# Patient Record
Sex: Male | Born: 1935 | Race: White | Hispanic: No | Marital: Single
Health system: Midwestern US, Academic
[De-identification: ages and names within clinical notes are randomized; demographics above are authoritative.]

---

## 2016-05-21 MED ORDER — WARFARIN 6 MG PO TAB
ORAL_TABLET | Freq: Every day | ORAL | 1 refills | 90.00000 days | Status: DC
Start: 2016-05-21 — End: 2017-11-20

## 2016-06-25 ENCOUNTER — Encounter: Admit: 2016-06-25 | Discharge: 2016-06-25 | Payer: MEDICARE

## 2016-07-03 ENCOUNTER — Ambulatory Visit: Admit: 2016-07-03 | Discharge: 2016-07-04 | Payer: MEDICARE

## 2016-07-03 DIAGNOSIS — I2510 Atherosclerotic heart disease of native coronary artery without angina pectoris: Principal | ICD-10-CM

## 2016-07-03 DIAGNOSIS — R001 Bradycardia, unspecified: ICD-10-CM

## 2016-07-03 DIAGNOSIS — Z950 Presence of cardiac pacemaker: ICD-10-CM

## 2016-07-04 ENCOUNTER — Encounter: Admit: 2016-07-04 | Discharge: 2016-07-04 | Payer: MEDICARE

## 2016-07-04 DIAGNOSIS — Z950 Presence of cardiac pacemaker: Principal | ICD-10-CM

## 2016-07-04 DIAGNOSIS — R001 Bradycardia, unspecified: ICD-10-CM

## 2016-07-09 ENCOUNTER — Encounter: Admit: 2016-07-09 | Discharge: 2016-07-09 | Payer: MEDICARE

## 2016-07-09 DIAGNOSIS — Z952 Presence of prosthetic heart valve: Principal | ICD-10-CM

## 2016-07-09 DIAGNOSIS — Z7901 Long term (current) use of anticoagulants: ICD-10-CM

## 2016-07-14 ENCOUNTER — Encounter: Admit: 2016-07-14 | Discharge: 2016-07-14 | Payer: MEDICARE

## 2016-07-14 MED ORDER — CARVEDILOL 6.25 MG PO TAB
3.125 mg | ORAL_TABLET | Freq: Two times a day (BID) | ORAL | 0 refills | 90.00000 days | Status: AC
Start: 2016-07-14 — End: 2017-02-12

## 2016-07-14 NOTE — Telephone Encounter
Recent hx of atach on device.  Pt c/o of new episodes of near syncope, recently.  Discussed with TLR orders received to Coreg titration upto 12.5mg  bid.

## 2016-07-23 ENCOUNTER — Encounter: Admit: 2016-07-23 | Discharge: 2016-07-23 | Payer: MEDICARE

## 2016-07-23 DIAGNOSIS — Z7901 Long term (current) use of anticoagulants: ICD-10-CM

## 2016-07-23 DIAGNOSIS — Z952 Presence of prosthetic heart valve: Principal | ICD-10-CM

## 2016-08-13 ENCOUNTER — Encounter: Admit: 2016-08-13 | Discharge: 2016-08-13 | Payer: MEDICARE

## 2016-08-13 NOTE — Telephone Encounter
INR Overdue. Called and left message requesting pt to have INR drawn at earliest convenience.  Left callback number for any questions or concerns.

## 2016-08-18 ENCOUNTER — Encounter: Admit: 2016-08-18 | Discharge: 2016-08-18 | Payer: MEDICARE

## 2016-08-20 ENCOUNTER — Encounter: Admit: 2016-08-20 | Discharge: 2016-08-20 | Payer: MEDICARE

## 2016-08-20 DIAGNOSIS — Z7901 Long term (current) use of anticoagulants: ICD-10-CM

## 2016-08-20 DIAGNOSIS — Z952 Presence of prosthetic heart valve: Principal | ICD-10-CM

## 2016-09-03 ENCOUNTER — Encounter: Admit: 2016-09-03 | Discharge: 2016-09-03 | Payer: MEDICARE

## 2016-09-03 DIAGNOSIS — Z952 Presence of prosthetic heart valve: Principal | ICD-10-CM

## 2016-09-03 DIAGNOSIS — Z7901 Long term (current) use of anticoagulants: ICD-10-CM

## 2016-09-24 ENCOUNTER — Encounter: Admit: 2016-09-24 | Discharge: 2016-09-24 | Payer: MEDICARE

## 2016-09-24 NOTE — Telephone Encounter
INR Overdue. Called and left message requesting pt to have INR drawn at earliest convenience.  Left callback number for any questions or concerns.

## 2016-10-03 NOTE — Telephone Encounter
INR Overdue.  Called and left message requesting pt to have INR drawn at earliest convenience.  Left callback number for any questions or concerns.

## 2016-10-15 ENCOUNTER — Encounter: Admit: 2016-10-15 | Discharge: 2016-10-15 | Payer: MEDICARE

## 2016-10-15 DIAGNOSIS — Z952 Presence of prosthetic heart valve: Principal | ICD-10-CM

## 2016-10-15 DIAGNOSIS — Z7901 Long term (current) use of anticoagulants: ICD-10-CM

## 2016-10-15 MED ORDER — WARFARIN 7.5 MG PO TAB
7.5 mg | ORAL_TABLET | Freq: Every day | ORAL | 3 refills | 90.00000 days | Status: AC
Start: 2016-10-15 — End: 2018-02-02

## 2016-10-31 LAB — LIPID PROFILE
Lab: 154 — ABNORMAL HIGH (ref <100)
Lab: 215 — ABNORMAL HIGH (ref 150–200)
Lab: 26 — ABNORMAL LOW (ref 33.0–37.0)
Lab: 5 — ABNORMAL HIGH (ref 8.8–10.0)

## 2016-10-31 LAB — COMPREHENSIVE METABOLIC PANEL
Lab: 1.6 — ABNORMAL HIGH (ref 0.0–1.0)
Lab: 137 — ABNORMAL LOW (ref 4.70–6.10)
Lab: 16 — ABNORMAL HIGH (ref 0–14)
Lab: 20
Lab: 3.8
Lab: 6.8
Lab: 66
Lab: 8

## 2016-10-31 LAB — PROSTATIC SPECIFIC ANTIGEN-PSA: Lab: 4 — ABNORMAL HIGH (ref 0.0–4.0)

## 2016-10-31 LAB — THYROID STIMULATING HORMONE-TSH: Lab: 1

## 2016-10-31 LAB — CBC: Lab: 10 — ABNORMAL HIGH (ref 4.8–10.8)

## 2016-11-05 ENCOUNTER — Encounter: Admit: 2016-11-05 | Discharge: 2016-11-05 | Payer: MEDICARE

## 2016-11-05 DIAGNOSIS — Z7901 Long term (current) use of anticoagulants: ICD-10-CM

## 2016-11-05 DIAGNOSIS — Z952 Presence of prosthetic heart valve: Principal | ICD-10-CM

## 2016-11-05 NOTE — Progress Notes
Spoke to pt re: INR results. Verified current Coumadin dose. Reviewed dose with pt for accuracy and he states he is taking 7.5mg  on Sat and 6mg  all other days. Flowsheet changed to reflect dose pt is actually taking, no actual dose change made.  Will continue current therapeutic dose and recheck in ~ 1 week. Pt verbalized understanding and was agreeable to this plan.  No further needs identified at this time.

## 2016-11-12 ENCOUNTER — Encounter: Admit: 2016-11-12 | Discharge: 2016-11-12 | Payer: MEDICARE

## 2016-11-12 DIAGNOSIS — Z7901 Long term (current) use of anticoagulants: ICD-10-CM

## 2016-11-12 DIAGNOSIS — Z952 Presence of prosthetic heart valve: Principal | ICD-10-CM

## 2016-11-26 ENCOUNTER — Encounter: Admit: 2016-11-26 | Discharge: 2016-11-26 | Payer: MEDICARE

## 2016-11-28 ENCOUNTER — Encounter: Admit: 2016-11-28 | Discharge: 2016-11-28 | Payer: MEDICARE

## 2016-11-28 DIAGNOSIS — Z952 Presence of prosthetic heart valve: Principal | ICD-10-CM

## 2016-12-17 ENCOUNTER — Encounter: Admit: 2016-12-17 | Discharge: 2016-12-17 | Payer: MEDICARE

## 2016-12-17 NOTE — Telephone Encounter
INR Overdue. Called and left message requesting pt to have INR drawn at earliest convenience.  Left callback number for any questions or concerns.

## 2016-12-26 NOTE — Telephone Encounter
INR overdue. Called and left message requesting pt to have INR drawn at earliest convenience. Left callback number for any questions or concerns.

## 2017-01-07 ENCOUNTER — Encounter: Admit: 2017-01-07 | Discharge: 2017-01-07 | Payer: MEDICARE

## 2017-01-07 DIAGNOSIS — Z7901 Long term (current) use of anticoagulants: ICD-10-CM

## 2017-01-07 DIAGNOSIS — Z952 Presence of prosthetic heart valve: Principal | ICD-10-CM

## 2017-01-07 NOTE — Progress Notes
A detailed message was left on secured voicemail reviewing the coumadin dosing that we understand they are on currently, and instructions of dosing as of today and when the next INR is due.  Patient was asked to call [phone] to confirm his dosing.

## 2017-01-21 ENCOUNTER — Encounter: Admit: 2017-01-21 | Discharge: 2017-01-21 | Payer: MEDICARE

## 2017-01-21 DIAGNOSIS — Z7901 Long term (current) use of anticoagulants: ICD-10-CM

## 2017-01-21 DIAGNOSIS — Z952 Presence of prosthetic heart valve: Principal | ICD-10-CM

## 2017-02-05 ENCOUNTER — Encounter: Admit: 2017-02-05 | Discharge: 2017-02-05 | Payer: MEDICARE

## 2017-02-12 ENCOUNTER — Ambulatory Visit: Admit: 2017-02-12 | Discharge: 2017-02-13 | Payer: MEDICARE

## 2017-02-12 ENCOUNTER — Encounter: Admit: 2017-02-12 | Discharge: 2017-02-12 | Payer: MEDICARE

## 2017-02-12 DIAGNOSIS — I779 Disorder of arteries and arterioles, unspecified: ICD-10-CM

## 2017-02-12 DIAGNOSIS — K2970 Gastritis, unspecified, without bleeding: ICD-10-CM

## 2017-02-12 DIAGNOSIS — I6529 Occlusion and stenosis of unspecified carotid artery: ICD-10-CM

## 2017-02-12 DIAGNOSIS — G459 Transient cerebral ischemic attack, unspecified: ICD-10-CM

## 2017-02-12 DIAGNOSIS — I2510 Atherosclerotic heart disease of native coronary artery without angina pectoris: ICD-10-CM

## 2017-02-12 DIAGNOSIS — E785 Hyperlipidemia, unspecified: Principal | ICD-10-CM

## 2017-02-12 DIAGNOSIS — I350 Nonrheumatic aortic (valve) stenosis: ICD-10-CM

## 2017-02-12 DIAGNOSIS — K222 Esophageal obstruction: ICD-10-CM

## 2017-02-12 MED ORDER — TAMSULOSIN 0.4 MG PO CAP
.8 mg | ORAL_CAPSULE | Freq: Every day | ORAL | 3 refills | 90.00000 days | Status: AC
Start: 2017-02-12 — End: 2019-02-17

## 2017-02-18 ENCOUNTER — Encounter: Admit: 2017-02-18 | Discharge: 2017-02-18 | Payer: MEDICARE

## 2017-02-18 DIAGNOSIS — Z7901 Long term (current) use of anticoagulants: Principal | ICD-10-CM

## 2017-02-18 LAB — PROTIME INR (PT): Lab: 2.5

## 2017-03-04 ENCOUNTER — Encounter: Admit: 2017-03-04 | Discharge: 2017-03-04 | Payer: MEDICARE

## 2017-03-04 DIAGNOSIS — Z952 Presence of prosthetic heart valve: Principal | ICD-10-CM

## 2017-03-04 DIAGNOSIS — Z7901 Long term (current) use of anticoagulants: ICD-10-CM

## 2017-03-04 LAB — PROTIME INR (PT): Lab: 2.5 mg/dL (ref 0.4–1.00)

## 2017-03-18 ENCOUNTER — Encounter: Admit: 2017-03-18 | Discharge: 2017-03-18 | Payer: MEDICARE

## 2017-04-01 ENCOUNTER — Encounter: Admit: 2017-04-01 | Discharge: 2017-04-01 | Payer: MEDICARE

## 2017-04-01 DIAGNOSIS — Z7901 Long term (current) use of anticoagulants: Principal | ICD-10-CM

## 2017-04-01 LAB — PROTIME INR (PT): Lab: 2.5

## 2017-04-15 ENCOUNTER — Encounter: Admit: 2017-04-15 | Discharge: 2017-04-15 | Payer: MEDICARE

## 2017-04-15 DIAGNOSIS — Z7901 Long term (current) use of anticoagulants: Principal | ICD-10-CM

## 2017-04-15 LAB — PROTIME INR (PT): Lab: 2.5

## 2017-04-30 ENCOUNTER — Encounter: Admit: 2017-04-30 | Discharge: 2017-04-30 | Payer: MEDICARE

## 2017-04-30 DIAGNOSIS — Z7901 Long term (current) use of anticoagulants: ICD-10-CM

## 2017-04-30 DIAGNOSIS — Z952 Presence of prosthetic heart valve: Principal | ICD-10-CM

## 2017-05-13 ENCOUNTER — Encounter: Admit: 2017-05-13 | Discharge: 2017-05-13 | Payer: MEDICARE

## 2017-05-27 ENCOUNTER — Encounter: Admit: 2017-05-27 | Discharge: 2017-05-27 | Payer: MEDICARE

## 2017-05-27 DIAGNOSIS — Z7901 Long term (current) use of anticoagulants: ICD-10-CM

## 2017-05-27 DIAGNOSIS — Z952 Presence of prosthetic heart valve: Principal | ICD-10-CM

## 2017-05-27 LAB — PROTIME INR (PT): Lab: 2.9

## 2017-05-28 LAB — COMPREHENSIVE METABOLIC PANEL
Lab: 1
Lab: 1
Lab: 10
Lab: 10 — ABNORMAL HIGH (ref 8.8–10.0)
Lab: 113 — ABNORMAL HIGH (ref 83–110)
Lab: 139
Lab: 14
Lab: 18
Lab: 19
Lab: 24
Lab: 4.1
Lab: 67
Lab: 69
Lab: 7.9

## 2017-05-28 LAB — FREE T4 (FREE THYROXINE) ONLY: Lab: 1

## 2017-05-28 LAB — THYROID STIMULATING HORMONE-TSH: Lab: 1.1

## 2017-06-10 ENCOUNTER — Encounter: Admit: 2017-06-10 | Discharge: 2017-06-10 | Payer: MEDICARE

## 2017-06-10 DIAGNOSIS — Z7901 Long term (current) use of anticoagulants: ICD-10-CM

## 2017-06-10 DIAGNOSIS — Z952 Presence of prosthetic heart valve: Principal | ICD-10-CM

## 2017-06-10 LAB — PROTIME INR (PT): Lab: 2.5

## 2017-06-24 ENCOUNTER — Encounter: Admit: 2017-06-24 | Discharge: 2017-06-24 | Payer: MEDICARE

## 2017-06-24 DIAGNOSIS — Z7901 Long term (current) use of anticoagulants: ICD-10-CM

## 2017-06-24 DIAGNOSIS — Z952 Presence of prosthetic heart valve: Principal | ICD-10-CM

## 2017-06-24 LAB — PROTIME INR (PT): Lab: 2.9 (ref 1.003–1.035)

## 2017-07-08 ENCOUNTER — Encounter: Admit: 2017-07-08 | Discharge: 2017-07-08 | Payer: MEDICARE

## 2017-07-08 DIAGNOSIS — Z952 Presence of prosthetic heart valve: Principal | ICD-10-CM

## 2017-07-08 DIAGNOSIS — Z7901 Long term (current) use of anticoagulants: ICD-10-CM

## 2017-07-08 LAB — PROTIME INR (PT): Lab: 2.5 % — ABNORMAL HIGH (ref 0.0–3.0)

## 2017-07-22 ENCOUNTER — Encounter: Admit: 2017-07-22 | Discharge: 2017-07-22 | Payer: MEDICARE

## 2017-07-22 DIAGNOSIS — Z7901 Long term (current) use of anticoagulants: ICD-10-CM

## 2017-07-22 DIAGNOSIS — Z952 Presence of prosthetic heart valve: Principal | ICD-10-CM

## 2017-07-22 LAB — PROTIME INR (PT): Lab: 2.5

## 2017-08-05 ENCOUNTER — Encounter: Admit: 2017-08-05 | Discharge: 2017-08-05 | Payer: MEDICARE

## 2017-08-05 DIAGNOSIS — Z7901 Long term (current) use of anticoagulants: ICD-10-CM

## 2017-08-05 DIAGNOSIS — Z952 Presence of prosthetic heart valve: Principal | ICD-10-CM

## 2017-08-05 LAB — PROTIME INR (PT): Lab: 2.5 M/UL (ref 4.4–5.5)

## 2017-08-18 ENCOUNTER — Encounter: Admit: 2017-08-18 | Discharge: 2017-08-18 | Payer: MEDICARE

## 2017-08-18 DIAGNOSIS — Z950 Presence of cardiac pacemaker: Principal | ICD-10-CM

## 2017-08-19 ENCOUNTER — Encounter: Admit: 2017-08-19 | Discharge: 2017-08-19 | Payer: MEDICARE

## 2017-08-19 DIAGNOSIS — Z7901 Long term (current) use of anticoagulants: ICD-10-CM

## 2017-08-19 DIAGNOSIS — Z952 Presence of prosthetic heart valve: Principal | ICD-10-CM

## 2017-08-19 LAB — PROTIME INR (PT): Lab: 2.5 pg (ref 26–34)

## 2017-08-25 ENCOUNTER — Ambulatory Visit: Admit: 2017-08-25 | Discharge: 2017-08-26 | Payer: MEDICARE

## 2017-08-25 ENCOUNTER — Encounter: Admit: 2017-08-25 | Discharge: 2017-08-25 | Payer: MEDICARE

## 2017-08-25 DIAGNOSIS — Z950 Presence of cardiac pacemaker: ICD-10-CM

## 2017-08-25 DIAGNOSIS — R001 Bradycardia, unspecified: Principal | ICD-10-CM

## 2017-09-03 ENCOUNTER — Encounter: Admit: 2017-09-03 | Discharge: 2017-09-03 | Payer: MEDICARE

## 2017-09-03 ENCOUNTER — Ambulatory Visit: Admit: 2017-09-03 | Discharge: 2017-09-04 | Payer: MEDICARE

## 2017-09-03 DIAGNOSIS — I350 Nonrheumatic aortic (valve) stenosis: ICD-10-CM

## 2017-09-03 DIAGNOSIS — Z952 Presence of prosthetic heart valve: Principal | ICD-10-CM

## 2017-09-03 DIAGNOSIS — K222 Esophageal obstruction: ICD-10-CM

## 2017-09-03 DIAGNOSIS — Z7901 Long term (current) use of anticoagulants: ICD-10-CM

## 2017-09-03 DIAGNOSIS — I2510 Atherosclerotic heart disease of native coronary artery without angina pectoris: ICD-10-CM

## 2017-09-03 DIAGNOSIS — G459 Transient cerebral ischemic attack, unspecified: ICD-10-CM

## 2017-09-03 DIAGNOSIS — I6529 Occlusion and stenosis of unspecified carotid artery: ICD-10-CM

## 2017-09-03 DIAGNOSIS — E7800 Pure hypercholesterolemia, unspecified: ICD-10-CM

## 2017-09-03 DIAGNOSIS — I779 Disorder of arteries and arterioles, unspecified: ICD-10-CM

## 2017-09-03 DIAGNOSIS — E785 Hyperlipidemia, unspecified: Principal | ICD-10-CM

## 2017-09-03 DIAGNOSIS — K2970 Gastritis, unspecified, without bleeding: ICD-10-CM

## 2017-09-16 ENCOUNTER — Encounter: Admit: 2017-09-16 | Discharge: 2017-09-16 | Payer: MEDICARE

## 2017-09-16 DIAGNOSIS — Z7901 Long term (current) use of anticoagulants: ICD-10-CM

## 2017-09-16 DIAGNOSIS — Z952 Presence of prosthetic heart valve: Principal | ICD-10-CM

## 2017-09-16 LAB — PROTIME INR (PT): Lab: 2.5 mg/dL (ref 0.4–1.24)

## 2017-09-23 ENCOUNTER — Encounter: Admit: 2017-09-23 | Discharge: 2017-09-23 | Payer: MEDICARE

## 2017-09-23 DIAGNOSIS — Z952 Presence of prosthetic heart valve: Principal | ICD-10-CM

## 2017-09-23 DIAGNOSIS — Z7901 Long term (current) use of anticoagulants: ICD-10-CM

## 2017-09-23 LAB — PROTIME INR (PT): Lab: 3.2 mg/dL — ABNORMAL HIGH (ref 70–100)

## 2017-10-07 ENCOUNTER — Encounter: Admit: 2017-10-07 | Discharge: 2017-10-07 | Payer: MEDICARE

## 2017-10-07 DIAGNOSIS — Z7901 Long term (current) use of anticoagulants: ICD-10-CM

## 2017-10-07 DIAGNOSIS — Z952 Presence of prosthetic heart valve: Principal | ICD-10-CM

## 2017-10-07 LAB — PROTIME INR (PT): Lab: 2.9

## 2017-10-21 ENCOUNTER — Encounter: Admit: 2017-10-21 | Discharge: 2017-10-21 | Payer: MEDICARE

## 2017-10-21 LAB — PROTIME INR (PT): Lab: 3.5

## 2017-11-04 ENCOUNTER — Encounter: Admit: 2017-11-04 | Discharge: 2017-11-04 | Payer: MEDICARE

## 2017-11-04 DIAGNOSIS — Z7901 Long term (current) use of anticoagulants: ICD-10-CM

## 2017-11-04 DIAGNOSIS — Z952 Presence of prosthetic heart valve: Principal | ICD-10-CM

## 2017-11-04 LAB — PROTIME INR (PT): Lab: 2.8

## 2017-11-18 ENCOUNTER — Encounter: Admit: 2017-11-18 | Discharge: 2017-11-18 | Payer: MEDICARE

## 2017-11-18 LAB — PROTIME INR (PT): Lab: 3.1

## 2017-11-20 ENCOUNTER — Encounter: Admit: 2017-11-20 | Discharge: 2017-11-20 | Payer: MEDICARE

## 2017-11-20 MED ORDER — WARFARIN 6 MG PO TAB
ORAL_TABLET | Freq: Every day | ORAL | 1 refills | 90.00000 days | Status: AC
Start: 2017-11-20 — End: ?

## 2017-12-04 ENCOUNTER — Encounter: Admit: 2017-12-04 | Discharge: 2017-12-04 | Payer: MEDICARE

## 2017-12-04 DIAGNOSIS — Z952 Presence of prosthetic heart valve: Principal | ICD-10-CM

## 2017-12-04 DIAGNOSIS — Z7901 Long term (current) use of anticoagulants: ICD-10-CM

## 2017-12-04 LAB — PROTIME INR (PT): Lab: 3.2

## 2017-12-23 ENCOUNTER — Encounter: Admit: 2017-12-23 | Discharge: 2017-12-23 | Payer: MEDICARE

## 2017-12-23 DIAGNOSIS — Z952 Presence of prosthetic heart valve: Principal | ICD-10-CM

## 2017-12-23 DIAGNOSIS — Z7901 Long term (current) use of anticoagulants: ICD-10-CM

## 2017-12-23 LAB — PROTIME INR (PT): Lab: 2.5

## 2017-12-31 LAB — COMPREHENSIVE METABOLIC PANEL
Lab: 0.5
Lab: 1.1 — ABNORMAL LOW (ref 33.0–37.0)
Lab: 10
Lab: 109 — ABNORMAL HIGH (ref 98–107)
Lab: 11
Lab: 12
Lab: 130 — ABNORMAL HIGH (ref 70–105)
Lab: 17
Lab: 21 — ABNORMAL HIGH (ref 27.0–31.0)
Lab: 22 — ABNORMAL LOW (ref 23–31)
Lab: 3.7
Lab: 64
Lab: 7.4
Lab: 91

## 2017-12-31 LAB — THYROID STIMULATING HORMONE-TSH: Lab: 1.5 — ABNORMAL LOW (ref 14.0–18.0)

## 2018-01-08 ENCOUNTER — Encounter: Admit: 2018-01-08 | Discharge: 2018-01-08 | Payer: MEDICARE

## 2018-01-08 DIAGNOSIS — Z952 Presence of prosthetic heart valve: ICD-10-CM

## 2018-01-08 DIAGNOSIS — Z7901 Long term (current) use of anticoagulants: Principal | ICD-10-CM

## 2018-01-20 ENCOUNTER — Encounter: Admit: 2018-01-20 | Discharge: 2018-01-20 | Payer: MEDICARE

## 2018-01-20 DIAGNOSIS — Z952 Presence of prosthetic heart valve: Secondary | ICD-10-CM

## 2018-01-20 DIAGNOSIS — Z7901 Long term (current) use of anticoagulants: Secondary | ICD-10-CM

## 2018-01-20 LAB — PROTIME INR (PT): Lab: 3.2 mg/dL — ABNORMAL HIGH (ref 70–100)

## 2018-02-02 ENCOUNTER — Encounter: Admit: 2018-02-02 | Discharge: 2018-02-02 | Payer: MEDICARE

## 2018-02-02 MED ORDER — WARFARIN 7.5 MG PO TAB
ORAL_TABLET | Freq: Every day | ORAL | 3 refills | 90.00000 days | Status: AC
Start: 2018-02-02 — End: 2019-02-04

## 2018-02-03 ENCOUNTER — Encounter: Admit: 2018-02-03 | Discharge: 2018-02-03 | Payer: MEDICARE

## 2018-02-03 DIAGNOSIS — Z952 Presence of prosthetic heart valve: Secondary | ICD-10-CM

## 2018-02-03 DIAGNOSIS — Z7901 Long term (current) use of anticoagulants: Secondary | ICD-10-CM

## 2018-02-03 LAB — PROTIME INR (PT): Lab: 3.5 10*3/uL (ref 0–0.45)

## 2018-02-17 ENCOUNTER — Encounter: Admit: 2018-02-17 | Discharge: 2018-02-17 | Payer: MEDICARE

## 2018-02-17 DIAGNOSIS — Z7901 Long term (current) use of anticoagulants: ICD-10-CM

## 2018-02-17 DIAGNOSIS — Z952 Presence of prosthetic heart valve: Principal | ICD-10-CM

## 2018-02-17 LAB — PROTIME INR (PT): Lab: 2.8

## 2018-03-03 ENCOUNTER — Encounter: Admit: 2018-03-03 | Discharge: 2018-03-03 | Payer: MEDICARE

## 2018-03-03 DIAGNOSIS — Z7901 Long term (current) use of anticoagulants: ICD-10-CM

## 2018-03-03 DIAGNOSIS — Z952 Presence of prosthetic heart valve: Principal | ICD-10-CM

## 2018-03-17 ENCOUNTER — Encounter: Admit: 2018-03-17 | Discharge: 2018-03-17 | Payer: MEDICARE

## 2018-03-17 DIAGNOSIS — Z952 Presence of prosthetic heart valve: Principal | ICD-10-CM

## 2018-03-17 DIAGNOSIS — Z7901 Long term (current) use of anticoagulants: ICD-10-CM

## 2018-03-17 LAB — PROTIME INR (PT): Lab: 2.5

## 2018-03-31 ENCOUNTER — Encounter: Admit: 2018-03-31 | Discharge: 2018-03-31 | Payer: MEDICARE

## 2018-04-14 ENCOUNTER — Encounter: Admit: 2018-04-14 | Discharge: 2018-04-14 | Payer: MEDICARE

## 2018-04-14 DIAGNOSIS — Z952 Presence of prosthetic heart valve: Principal | ICD-10-CM

## 2018-04-14 DIAGNOSIS — Z7901 Long term (current) use of anticoagulants: ICD-10-CM

## 2018-04-28 ENCOUNTER — Encounter: Admit: 2018-04-28 | Discharge: 2018-04-28 | Payer: MEDICARE

## 2018-04-28 DIAGNOSIS — Z952 Presence of prosthetic heart valve: Principal | ICD-10-CM

## 2018-04-28 DIAGNOSIS — Z7901 Long term (current) use of anticoagulants: ICD-10-CM

## 2018-05-12 ENCOUNTER — Encounter: Admit: 2018-05-12 | Discharge: 2018-05-12 | Payer: MEDICARE

## 2018-05-26 ENCOUNTER — Encounter: Admit: 2018-05-26 | Discharge: 2018-05-26 | Payer: MEDICARE

## 2018-06-01 ENCOUNTER — Ambulatory Visit: Admit: 2018-06-01 | Discharge: 2018-06-02 | Payer: MEDICARE

## 2018-06-01 ENCOUNTER — Encounter: Admit: 2018-06-01 | Discharge: 2018-06-01 | Payer: MEDICARE

## 2018-06-01 DIAGNOSIS — R001 Bradycardia, unspecified: ICD-10-CM

## 2018-06-01 DIAGNOSIS — Z950 Presence of cardiac pacemaker: ICD-10-CM

## 2018-06-09 ENCOUNTER — Encounter: Admit: 2018-06-09 | Discharge: 2018-06-09 | Payer: MEDICARE

## 2018-06-11 ENCOUNTER — Encounter: Admit: 2018-06-11 | Discharge: 2018-06-11 | Payer: MEDICARE

## 2018-06-15 ENCOUNTER — Ambulatory Visit: Admit: 2018-06-15 | Discharge: 2018-06-16

## 2018-06-15 ENCOUNTER — Encounter: Admit: 2018-06-15 | Discharge: 2018-06-15

## 2018-06-15 DIAGNOSIS — E785 Hyperlipidemia, unspecified: Secondary | ICD-10-CM

## 2018-06-15 DIAGNOSIS — G459 Transient cerebral ischemic attack, unspecified: Secondary | ICD-10-CM

## 2018-06-15 DIAGNOSIS — I6529 Occlusion and stenosis of unspecified carotid artery: Secondary | ICD-10-CM

## 2018-06-15 DIAGNOSIS — K222 Esophageal obstruction: Secondary | ICD-10-CM

## 2018-06-15 DIAGNOSIS — I779 Disorder of arteries and arterioles, unspecified: Secondary | ICD-10-CM

## 2018-06-15 DIAGNOSIS — I2510 Atherosclerotic heart disease of native coronary artery without angina pectoris: Secondary | ICD-10-CM

## 2018-06-15 DIAGNOSIS — I350 Nonrheumatic aortic (valve) stenosis: Secondary | ICD-10-CM

## 2018-06-15 DIAGNOSIS — M25511 Pain in right shoulder: Secondary | ICD-10-CM

## 2018-06-15 DIAGNOSIS — W19XXXS Unspecified fall, sequela: Secondary | ICD-10-CM

## 2018-06-15 DIAGNOSIS — K2970 Gastritis, unspecified, without bleeding: Secondary | ICD-10-CM

## 2018-06-15 NOTE — Progress Notes
Date of Service: 06/15/2018    Dominic Olsen is a 85 y.o. male.       HPI     I saw Dominic Olsen today to follow up his nearing ERI status for his pacemaker.     That has not been troubling him, but he had a fall about 2-1/2 weeks ago from a high chair, landed on his right shoulder.  He has had profound discomfort and decreased range of motion since that time.  He did not want to go to the hospital for an x-rays, but I think it is clear that he has had significant trauma to his right shoulder, head, and I think an x-ray is very appropriate at this time to try and sort out exactly the nature of his injury.  We will try to arrange that this afternoon and then make arrangements for orthopedic referral.  He called his chiropractor about the symptoms, but they felt that it was something that they did want to deal with because of its severity, and he has not seen them.  He has been limping along since that time, trying to get by without seeking medical attention, but it is clear he has severely restricted motion of his right shoulder.  That fall also was the occasion for periorbital bleeding.  He did not seek medical attention for that either.    ([id number])             Vitals:    06/15/18 1538 06/15/18 1545   BP: 118/68 122/70   BP Source: Arm, Left Upper Arm, Right Upper   Pulse:  103   SpO2: 96%    Weight: 88.9 kg (196 lb)    Height: 1.778 m (5' 10)    PainSc: Zero      Body mass index is 28.12 kg/m???.     Past Medical History  Patient Active Problem List    Diagnosis Date Noted   ??? Preoperative cardiovascular examination 05/10/2015   ??? CAD (coronary artery disease) 03/23/2015     11/29/2007   IMPRESSIONS:  1. Critical mid left anterior descending artery disease status post 3.0 x 15 Xience drug-  ??? ???eluting stent deployment postdilated to 3.25 with TIMI 3 flow postprocedure, 0 percent residual stenosis, and no complication.  2. Prosthetic aortic valve noted.       ??? S/P aortic valve replacement 05/25/2008 ??? Stable angina (HCC) 11/26/2007   ??? Abnormal cardiovascular stress test 11/26/2007   ??? Chronic anticoagulation 11/26/2007   ??? 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 Numbness of arm 02/09/2006   ??? Aortic stenosis      AVR     ??? Hyperlipidemia    ??? Gastritis    ??? Esophageal stricture          Review of Systems   Constitution: Negative.   HENT: Negative.    Eyes: Negative.    Cardiovascular: Positive for claudication.   Respiratory: Negative.    Endocrine: Negative.    Hematologic/Lymphatic: Negative.    Skin: Negative.    Musculoskeletal: Negative.    Gastrointestinal: Negative.    Genitourinary: Negative.    Neurological: Negative.    Psychiatric/Behavioral: Positive for depression.   Allergic/Immunologic: Negative.        Physical Exam  Examination reveals an 85 year old gentleman obviously in some continued distress.  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  His aortic valve was for aortic stenosis and regurgitation.     He has been mostly conservative management since that time.  He has no peripheral edema, cyanosis, or clubbing.  Pulses intact in the upper extremities bilaterally.    ([id number])        Cardiovascular Studies  He is currently approaching ERI and will need every-6-week checks until he reaches elective replacement.    ([id number])        Problems Addressed Today  No diagnosis found.    Assessment and Plan     In summary, this is an 85 year old gentleman, on Coumadin for aortic valve replacement previously, also stent angioplasty, pacemaker for carotid sinus hypersensitivity, and now with a fall without syncope with some minor facial trauma, but with a significant right shoulder injury from which he has been in severe pain and with limited shoulder motion.  I am concerned that he may have had an unrecognized fracture.  He has not had x-rays.  We will do those as soon as possible, and he will need orthopedic consultation as well.  We will start with the x-rays.    ([id number])             Current Medications (including today's revisions)  ??? MULTIVITAMIN PO Take 1 tablet by mouth daily.   ??? tamsulosin (FLOMAX) 0.4 mg capsule Take two capsules by mouth daily. Do not crush, chew or open capsules. Take 30 minutes following the same meal each day.   ??? warfarin (COUMADIN) 6 mg tablet TAKE 1 TO 2 TABLETS BY MOUTH ONCE DAILY AS  DIRECTED   ??? warfarin (COUMADIN) 7.5 mg tablet TAKE 1 TABLET BY MOUTH ONCE DAILY AS  DIRECTED  BY  CARDIOLOGY

## 2018-06-16 ENCOUNTER — Encounter: Admit: 2018-06-16 | Discharge: 2018-06-16

## 2018-06-16 NOTE — Progress Notes
Called pt with results and recommendations.  Per TLR pt will need MRI of R shoulder.  Pt has a PM.  Will check to see if PM is MRI compatible and then schedule MRI.  Pt would like MRI done at [facility] or [facility].  Will call pt back to schedule.     R shoulder xray:

## 2018-06-17 ENCOUNTER — Encounter: Admit: 2018-06-17 | Discharge: 2018-06-17

## 2018-06-23 ENCOUNTER — Encounter: Admit: 2018-06-23 | Discharge: 2018-06-23

## 2018-06-23 DIAGNOSIS — Z952 Presence of prosthetic heart valve: Secondary | ICD-10-CM

## 2018-06-23 DIAGNOSIS — Z7901 Long term (current) use of anticoagulants: Secondary | ICD-10-CM

## 2018-07-01 ENCOUNTER — Encounter: Admit: 2018-07-01 | Discharge: 2018-07-01

## 2018-07-01 DIAGNOSIS — Z952 Presence of prosthetic heart valve: Secondary | ICD-10-CM

## 2018-07-01 DIAGNOSIS — I350 Nonrheumatic aortic (valve) stenosis: Secondary | ICD-10-CM

## 2018-07-01 DIAGNOSIS — Z7901 Long term (current) use of anticoagulants: Secondary | ICD-10-CM

## 2018-07-02 ENCOUNTER — Encounter: Admit: 2018-07-02 | Discharge: 2018-07-02

## 2018-07-02 NOTE — Telephone Encounter
Patient called back.  Pt wishes to hold off having the MRI for now.  He reports his should pain is improving and does not want to drive to [place].  We will postpone MRI for now and I will call pt next month to f/u.  No further needs identified at this time.

## 2018-07-06 NOTE — Progress Notes
Patient reports shoulder pain is improving and wishes to hold off completing the MRI.  Advised pt to call if he changes his mind.  No further needs at this time.

## 2018-07-07 ENCOUNTER — Encounter: Admit: 2018-07-07 | Discharge: 2018-07-07

## 2018-07-07 DIAGNOSIS — I350 Nonrheumatic aortic (valve) stenosis: Secondary | ICD-10-CM

## 2018-07-07 DIAGNOSIS — Z952 Presence of prosthetic heart valve: Secondary | ICD-10-CM

## 2018-07-07 DIAGNOSIS — Z7901 Long term (current) use of anticoagulants: Secondary | ICD-10-CM

## 2018-07-07 LAB — PROTIME INR (PT): Lab: 2.7

## 2018-07-21 ENCOUNTER — Encounter: Admit: 2018-07-21 | Discharge: 2018-07-21

## 2018-07-21 DIAGNOSIS — Z952 Presence of prosthetic heart valve: Secondary | ICD-10-CM

## 2018-07-21 DIAGNOSIS — Z7901 Long term (current) use of anticoagulants: Secondary | ICD-10-CM

## 2018-07-21 NOTE — Progress Notes
07/21/2018 1:23 PM  INR 2.5. No adjustments made. Pt usually makes adjustments on his own which he has been advised not to. Pt/LL      Pt also mentions he had an episode on Monday where he became overheated and felt like he was having a heat stroke.  He mentioned he was dizzy and his gait was unsteady.  He rested and hydrated himself and felt better.  I mentioned this could also be signs of TIA and offered to make an appt for the patient to be seen in our office, but pt declined. I advised pt to call back if he changes his mind or if sx return to call 911. Pt verbalized understanding.

## 2018-07-27 ENCOUNTER — Ambulatory Visit: Admit: 2018-07-27 | Discharge: 2018-07-28

## 2018-07-27 ENCOUNTER — Encounter: Admit: 2018-07-27 | Discharge: 2018-07-27

## 2018-07-27 DIAGNOSIS — Z1159 Encounter for screening for other viral diseases: Secondary | ICD-10-CM

## 2018-07-27 DIAGNOSIS — R001 Bradycardia, unspecified: Secondary | ICD-10-CM

## 2018-07-27 DIAGNOSIS — Z950 Presence of cardiac pacemaker: Secondary | ICD-10-CM

## 2018-07-27 DIAGNOSIS — I350 Nonrheumatic aortic (valve) stenosis: Secondary | ICD-10-CM

## 2018-07-27 MED ORDER — CEFAZOLIN INJ 1GM IVP
1 g | INTRAVENOUS | 0 refills | Status: CN
Start: 2018-07-27 — End: ?

## 2018-07-27 MED ORDER — LIDOCAINE (PF) 10 MG/ML (1 %) IJ SOLN
.1-2 mL | INTRAMUSCULAR | 0 refills | Status: CN | PRN
Start: 2018-07-27 — End: ?

## 2018-07-27 MED ORDER — CEFAZOLIN INJ 1GM IVP
2 g | Freq: Once | INTRAVENOUS | 0 refills | Status: CN
Start: 2018-07-27 — End: ?

## 2018-07-27 NOTE — Telephone Encounter
Left VM to CB and discuss battery change for device.    Available dates currently with MPE: 7/21, 7/23, 7/29 and 8/7.  Instructions done and case entered-can be scheduled from the depot.  He needs an H&P-possibly APP at [facility] clinic?

## 2018-07-27 NOTE — Telephone Encounter
-----   Message from Charlane Ferretti, RN sent at 07/27/2018  1:07 PM CDT -----  Regarding: MPE implanted, needs gen change  Has dual chamber medtronic PM Dr. Larina Bras  implanted.  Pt of Dr. Aris [place]  No remote  Hit ERI - "replacement time" 07/07/18 @ 11:20am  Needs set up for gen change

## 2018-07-28 NOTE — Telephone Encounter
MPE- RC at 5 :09pm yesterday to schedule gent change. He is at [phone].   Received: Today      Call patient   Message Contents   Louis Meckel, LPN  P Cvm Nurse Ep     This RN contacted pt in regards to scheduling generator change with MPE. Available dates currently with MPE: 7/21, 7/23, 7/29 and 8/7 reviewed with pt. Pt stated that he will call us back with date. Pt verbalized understanding. Encouraged pt to call us with any further questions or concerns.   RN will send follow up reminder to EP pool.

## 2018-07-29 ENCOUNTER — Encounter: Admit: 2018-07-29 | Discharge: 2018-07-29

## 2018-07-29 ENCOUNTER — Ambulatory Visit: Admit: 2018-08-05 | Discharge: 2018-08-05

## 2018-07-29 DIAGNOSIS — Z950 Presence of cardiac pacemaker: Secondary | ICD-10-CM

## 2018-07-29 DIAGNOSIS — I495 Sick sinus syndrome: Secondary | ICD-10-CM

## 2018-07-29 DIAGNOSIS — R001 Bradycardia, unspecified: Secondary | ICD-10-CM

## 2018-07-29 LAB — BASIC METABOLIC PANEL
Lab: 0.9
Lab: 107
Lab: 111 — ABNORMAL HIGH (ref 98–107)
Lab: 140
Lab: 17
Lab: 22 — ABNORMAL LOW (ref 23–31)
Lab: 4.7
Lab: 9.6

## 2018-07-29 LAB — CBC
Lab: 13
Lab: 247
Lab: 32
Lab: 32 — ABNORMAL HIGH (ref 27–31)
Lab: 37 — ABNORMAL LOW (ref 42–52)
Lab: 98
Lab: 6.5

## 2018-07-29 LAB — MAGNESIUM: Lab: 2.1

## 2018-07-29 NOTE — Progress Notes
Medicare is listed as patient's primary insurance coverage.  Pre-certification is not required for hospitalizations.

## 2018-07-30 ENCOUNTER — Encounter: Admit: 2018-07-30 | Discharge: 2018-07-30

## 2018-07-30 NOTE — Telephone Encounter
-----   Message from Rich Reining, APRN-NP sent at 07/30/2018  9:22 AM CDT -----  Ok to proceed.    Katie  ----- Message -----  From: Richard Miu, RN  Sent: 07/29/2018   3:00 PM CDT  To: Cherlyn Labella App    FYI pt scheduled for gen change 7/23 with MPE.   Hgb 12.2 now from 13.1 (7 months ago).   ----- Message -----  From: Annette Stable  Sent: 07/29/2018   1:21 PM CDT  To: Cvm Nurse Ep

## 2018-07-30 NOTE — Progress Notes
FW: MPE implanted, needs gen change   Received: Today   Message Contents   Paulino Rily, RN  P Cvm Nurse Ep            Did pt CB with offered date?    Previous Messages      ----- Message -----   From: Thomasene Mohair, RN   Sent: 07/28/2018   To: Cvm Nurse Ep   Subject: FW: MPE implanted, needs gen change          Patient CB to schedule? Instructions completed - just need updated.     Case entered - can schedule from the depot!   ----- Message -----   From: Royann Shivers, Gasper Sells, RN   Sent: 07/27/2018  1:07 PM CDT   To: Cvm Nurse Ep   Subject: MPE implanted, needs gen change            Has dual chamber medtronic PM Dr. Larina Bras implanted. Pt of Dr. Aris Georgia   No remote   Hit ERI - "replacement time" 07/07/18 @ 11:20am   Needs set up for gen change         The last documentation we have related to scheduling this case was that we were waiting for the patient to [facility] w/ an agreeable procedure date (see 7/14 Telephone Encounter).    The case is scheduled for 7/23.  Instructions reflect a 7/23 procedure date.    Per Mel Kimmel via Skype...  Lucy edited it/added 07/23 date on 07/16 at 1011.

## 2018-08-02 LAB — COVID-19 (SARS-COV-2) PCR

## 2018-08-03 ENCOUNTER — Encounter: Admit: 2018-08-03 | Discharge: 2018-08-03

## 2018-08-03 ENCOUNTER — Ambulatory Visit: Admit: 2018-08-05 | Discharge: 2018-08-05

## 2018-08-03 ENCOUNTER — Ambulatory Visit: Admit: 2018-08-03 | Discharge: 2018-08-03

## 2018-08-03 NOTE — Telephone Encounter
Pt needs updated H/P prior to gen change.  Spoke with patient who is "very angry with our entire clinic and staff, I don't understand how you all cannot just figure this out."  "I don't have time to go to another appt. I sat in [facility] for hrs yesterday waiting for my COVID test and now this!"    Finally told patient that we will have to update on admit if ok with our providers.   Informed pt that I will CB.

## 2018-08-03 NOTE — Telephone Encounter
Spoke to patient regarding INR and time of arrival on Thursday.  Spoke with Johann Capers, RN in the EP lab and confirmed time of arrival to be 1030. Informed Tessa, RN that the patient will need updated H/P as his last was 6/2 and pt refused to travel for another appt "just because we can't keep things straight."    Pt verbalized understanding of POC.  EP Lab to call and review plan per protocol tomorrow.

## 2018-08-03 NOTE — Telephone Encounter
-----   Message from Rich Reining, APRN-NP sent at 08/03/2018  1:38 PM CDT -----  Patient scheduled for a gen change on Thursday.  No H&P scheduled.    Katie

## 2018-08-03 NOTE — Telephone Encounter
Called pt and LVM to check if pt obtained a INR today and to record result for upcoming procedure on 08/05/2018.  Pt instructed to CB to clinic with INR result.     Plan to CB pt later today if no INR recorded.

## 2018-08-04 ENCOUNTER — Encounter: Admit: 2018-08-04 | Discharge: 2018-08-04

## 2018-08-04 DIAGNOSIS — Z1159 Encounter for screening for other viral diseases: Secondary | ICD-10-CM

## 2018-08-04 NOTE — Telephone Encounter
-----   Message from Betsy Pries, RN sent at 07/31/2018  6:24 PM CDT -----  Regarding: COVID RESULTS  Please watch for COVID RESULTS.  Gen change scheduled for 7/23.  Pt will have COVID Testing done at [facility] on 7/20.

## 2018-08-04 NOTE — Telephone Encounter
Called patient.  Advised that negative COVID results received.     Called patient to advise.  He has no further questions at this time.

## 2018-08-04 NOTE — Telephone Encounter
Contacted patient and confirmed name and DOB. Patient advised that COVID-19 test results are negative. Advised that patient can continue with the procedure and should follow pre-procedure instructions. Advised that if they develop any concerning symptoms prior to the procedure to contact their procedure team, specialist, and/or PCP for assistance.     Jaykwon Morones, RN

## 2018-08-04 NOTE — Progress Notes
Cardiovascular Labs Scheduling Checklist   1. Date of procedure: 07/23    2. Arrival time: 1030    3. Patient instructed NPO after MN; clear liquids until: 0930    4. Instructed to check-in at the heart hospital registration desk and bring photo id, insurance cards and a current list of home medications.  Pack an overnight bag in the event you are admitted overnight:yes     5. If you have a history of sleep apnea and use a C-Pap or Bi-Pap machine, please bring it with you to the hospital:no    6. Have a driver available upon discharge as you may not be cleared to drive for 48 hours or more post procedure. (exception is RHC/biopsy patient with internal jugular approach not receiving sedation): yes    7. If the patient's language preference is other than English, please indicated native language and need for interpreter:      8. Patient instructed to drink 64 oz water the day before their procedure if applicable and not contraindicated: no    9. Patient instructed no caffeine for 24 hours before procedure: yes    10. Pre-procedure medications reviewed: yes  Hold the following Medications: Continue to take the following:                     11. Contrast allergy with need for contrast allergy prophylaxis: no    12. Patient instructed to bath with antibacterial soap or surgical scrub:yes    13. List all same day pre-procedure requirements, i.e. Labs/H&P/EKG:no    14. List any same day pre-procedure appointments:no    15. List any isolation precautions and organism:no    16. List any special considerations: no    17. Patient instructed to prepare for delays as there may be urgent or emergent cases: yes    18. Updated visitor guidelines reviewed  (1 visitor, only allowed in waiting room):yes    19. Patient acknowledges understanding of pre-procedure instructions: yes

## 2018-08-05 ENCOUNTER — Encounter: Admit: 2018-08-05 | Discharge: 2018-08-05

## 2018-08-05 ENCOUNTER — Ambulatory Visit: Admit: 2018-08-05 | Discharge: 2018-08-05

## 2018-08-05 DIAGNOSIS — G9001 Carotid sinus syncope: Secondary | ICD-10-CM

## 2018-08-05 DIAGNOSIS — K222 Esophageal obstruction: Secondary | ICD-10-CM

## 2018-08-05 DIAGNOSIS — I779 Disorder of arteries and arterioles, unspecified: Secondary | ICD-10-CM

## 2018-08-05 DIAGNOSIS — I350 Nonrheumatic aortic (valve) stenosis: Secondary | ICD-10-CM

## 2018-08-05 DIAGNOSIS — Z45010 Encounter for checking and testing of cardiac pacemaker pulse generator [battery]: Secondary | ICD-10-CM

## 2018-08-05 DIAGNOSIS — E785 Hyperlipidemia, unspecified: Secondary | ICD-10-CM

## 2018-08-05 DIAGNOSIS — G459 Transient cerebral ischemic attack, unspecified: Secondary | ICD-10-CM

## 2018-08-05 DIAGNOSIS — I2510 Atherosclerotic heart disease of native coronary artery without angina pectoris: Secondary | ICD-10-CM

## 2018-08-05 DIAGNOSIS — I495 Sick sinus syndrome: Principal | ICD-10-CM

## 2018-08-05 DIAGNOSIS — I6529 Occlusion and stenosis of unspecified carotid artery: Secondary | ICD-10-CM

## 2018-08-05 DIAGNOSIS — K2970 Gastritis, unspecified, without bleeding: Secondary | ICD-10-CM

## 2018-08-05 LAB — POC PT/INR: Lab: 1.9 — ABNORMAL HIGH (ref 0.8–1.2)

## 2018-08-05 MED ORDER — HYDROCODONE-ACETAMINOPHEN 5-325 MG PO TAB
1 | ORAL | 0 refills | Status: DC | PRN
Start: 2018-08-05 — End: 2018-08-06

## 2018-08-05 MED ORDER — CEFAZOLIN INJ 1GM IVP
2 g | Freq: Once | INTRAVENOUS | 0 refills | Status: CP
Start: 2018-08-05 — End: ?

## 2018-08-05 MED ORDER — CEPHALEXIN 500 MG PO CAP
500 mg | ORAL_CAPSULE | Freq: Four times a day (QID) | ORAL | 0 refills | Status: AC
Start: 2018-08-05 — End: ?

## 2018-08-05 MED ORDER — DOCUSATE SODIUM 100 MG PO CAP
100 mg | Freq: Every day | ORAL | 0 refills | Status: DC | PRN
Start: 2018-08-05 — End: 2018-08-06

## 2018-08-05 MED ORDER — TEMAZEPAM 15 MG PO CAP
15 mg | Freq: Every evening | ORAL | 0 refills | Status: DC | PRN
Start: 2018-08-05 — End: 2018-08-06

## 2018-08-05 MED ORDER — SODIUM CHLORIDE 0.9 % IV SOLP
1000 mL | INTRAVENOUS | 0 refills | Status: DC
Start: 2018-08-05 — End: 2018-08-06
  Administered 2018-08-05: 16:00:00 1000 mL via INTRAVENOUS

## 2018-08-05 MED ORDER — CEFAZOLIN INJ 1GM IVP
1 g | INTRAVENOUS | 0 refills | Status: DC
Start: 2018-08-05 — End: 2018-08-06

## 2018-08-05 MED ORDER — LIDOCAINE (PF) 10 MG/ML (1 %) IJ SOLN
.1-2 mL | INTRAMUSCULAR | 0 refills | Status: DC | PRN
Start: 2018-08-05 — End: 2018-08-06

## 2018-08-05 MED ORDER — ALUMINUM-MAGNESIUM HYDROXIDE 200-200 MG/5 ML PO SUSP
30 mL | ORAL | 0 refills | Status: DC | PRN
Start: 2018-08-05 — End: 2018-08-06

## 2018-08-05 MED ORDER — ACETAMINOPHEN 325 MG PO TAB
650 mg | ORAL | 0 refills | Status: DC | PRN
Start: 2018-08-05 — End: 2018-08-06

## 2018-08-05 NOTE — Progress Notes
08/05/18 1636   Vital Signs   BP (!) 167/63   Mean NBP (Calculated) 91 MM HG   Ruth NP notified of elevated BP. Will continue to monitor.

## 2018-08-05 NOTE — Progress Notes
Patient arrived on unit via ambulation accompanied by family. Patient transferred to the bed without assistance. Frailty score equals 4  Assessment completed, refer to flowsheet for details. Orders released, reviewed, and implemented as appropriate. Oriented to surroundings, call light within reach. Plan of care reviewed.  Will continue to monitor and assess.

## 2018-08-05 NOTE — Patient Instructions
Generator Change Post Procedure Instructions-TUKH  Call Immediately For The Following:  ??? Drainage- blood or pus coming from the incision  ??? Opening of the edges of the incision  ??? InCreased tenderness around the incision  ??? Temperature- warmth around the incision or fever  ??? Odor from the wound  ??? Redness worsening around the incision???  Caring for your incision  ??? Keep your incision clean and dry for 2-3 days after your procedure. Take sponge baths working around the incision during this time.  ??? May shower 2-3 days after the procedure, but avoid getting the dressing wet. Keep the incision site and dressing dry until your incision check appointment.  ??? Dressing will be removed at the incision check appointment by a nurse.  ??? If dressing becomes loose on the edges, try to tape down the loose areas to keep the dressing on until your incision check appointment.  ??? If there is bleeding or drainage please call our nurse triage line at [phone].  ??? Do not submerge incision in bath tub, pool, hot tub, or lake for 4 weeks.  ??? Avoid using deodorants, powders, creams, lotions, ointments, etc. on your incision for 4 weeks.  ??? There are no stitches to be removed. Steri-strips (strips of tape) will begin to fall off in 10-14 days. If they remain after 2 weeks, gently remove them when they are damp after your shower.  ??? Your incision should look better each day. If you notice unusual swelling, redness, drainage, have more pain at the site, or have a fever greater than 100 degrees, call us immediately.  ??? Be sure to complete ALL the antibiotics prescribed.??????  What are my activity restrictions?  ??? Do not mow the lawn (push or riding mover), use a weed whacker, or garden for 4 weeks (too much dust and dirt).  ??? You should be able to resume your normal routine after the first week.  ??? You may participate in sexual activity unless otherwise instructed.  ??? You may return to work within 3-5 days unless told otherwise. This will vary with your job, age, and overall physical condition.???  When do I follow up?  ??? Follow up in7-10 daysfor an incision check with a nurse at [facility] or your family doctor or nurse practitioner.  ??? Follow up with your primary cardiologist in 6 months with a full device evaluation and office visit.???  Other important information???  ??? Avoid, if possible, any dental, gastrointestinal, or urinary procedures for 1 month post device placement. If the procedure cannot be avoided, call your cardiologist as you may need antibiotics prior to your procedure.  ??? Report any of the following symptoms to [facility]  ? Dizziness or lightheadedness  ? Slow pulse  ? Feel like you may pass out or faint  ? Shortness of breath  ? If you notice any unusual swelling, redness, drainage, fever, or increasing pain at your incision site???  Who do I contact if I need to speak with someone????  During Business Hours: Call [facility] electrophysiology nurse triage line at [phone].  Nights and Weekends: Call [facility] on call doctor at [phone].  ???[facility]  ???[facility]iac Device Management??????  This???information is meant to serve as a resource to you and your family. It is not meant to be all inclusive. The members of the [facility] at [facility], [phone], will be glad to answer  any questions you may have about this booklet or your procedure.

## 2018-08-05 NOTE — H&P (View-Only)
History and Physical Update Note    Allergies:  Patient has no known allergies.    Lab/Radiology/Other Diagnostic Tests:  Hematology:    Lab Results   Component Value Date    HGB 12.2 07/29/2018    HCT 37.6 07/29/2018    PLTCT 247 07/29/2018    WBC 6.5 07/29/2018    NEUT 47 12/01/2007    ANC 3.18 12/01/2007    ALC 2.55 12/01/2007    MONA 10 12/01/2007    AMC 0.65 12/01/2007    ABC 0.05 12/01/2007    MCV 98.5 07/29/2018    MCHC 32.5 07/29/2018    MPV 8.0 12/01/2007    RDW 13.0 07/29/2018   , Coagulation:    Lab Results   Component Value Date    PT 12.4 02/09/2006    PTT 39.9 12/01/2007    INR 1.9 08/05/2018    INR 2.4 08/03/2018    and General Chemistry:    Lab Results   Component Value Date    NA 140 07/29/2018    K 4.7 07/29/2018    CL 111 07/29/2018    GAP 12 12/31/2017    BUN 17 07/29/2018    CR 0.98 07/29/2018    GLU 107 07/29/2018    CA 9.6 07/29/2018    ALBUMIN 3.7 12/31/2017    MG 2.1 07/29/2018    TOTBILI 0.50 12/31/2017     Point of Care Testing:  (Last 24 hours):         I have examined the patient, and there are no significant changes in their condition, from the previous H&P performed on 06/15/18.     Regarding he below mentioned injuries to his shoulder and head: patient reports he had  x ray imaging in [place] which he states were inconclusive. MRI was suggested but he declined. He still has limited range of motion with his right arm, but states that it's getting better daily and declines further evaluation.     No recent illnesses or hospitalizations.     Elmon Else, APRN-NP  Pager [phone]    --------------------------------------------------------------------------------------------------------------------------------------------  Dominic Olsen is a 85 y.o. male.     ???  HPI  I saw Mr. Visitacion today to follow up his nearing ERI status for his pacemaker.   ???  That has not been troubling him, but he had a fall about 2-1/2 weeks ago from a high chair, landed on his right shoulder.  He has had profound discomfort and decreased range of motion since that time.  He did not want to go to the hospital for an x-rays, but I think it is clear that he has had significant trauma to his right shoulder, head, and I think an x-ray is very appropriate at this time to try and sort out exactly the nature of his injury.  We will try to arrange that this afternoon and then make arrangements for orthopedic referral.  He called his chiropractor about the symptoms, but they felt that it was something that they did want to deal with because of its severity, and he has not seen them.  He has been limping along since that time, trying to get by without seeking medical attention, but it is clear he has severely restricted motion of his right shoulder.  That fall also was the occasion for periorbital bleeding.  He did not seek medical attention for that either.  ???  ([id number])  ???  ???  ???  ???  Vitals:   ??? 06/15/18 1538 06/15/18 1545   BP: 118/68 122/70   BP Source: Arm, Left Upper Arm, Right Upper   Pulse: ??? 103   SpO2: 96% ???   Weight: 88.9 kg (196 lb) ???   Height: 1.778 m (5' 10) ???   PainSc: Zero ???   ???  Body mass index is 28.12 kg/m???.   ???  Past Medical History  Patient Active Problem List   ??? Diagnosis Date Noted   ??? Preoperative cardiovascular examination 05/10/2015   ??? CAD (coronary artery disease) 03/23/2015   ??? ??? 11/29/2007   IMPRESSIONS:  1. Critical mid left anterior descending artery disease status post 3.0 x 15 Xience drug-  ??? ???eluting stent deployment postdilated to 3.25 with TIMI 3 flow postprocedure, 0 percent residual stenosis, and no complication.  2. Prosthetic aortic valve noted.  ???  ???   ??? S/P aortic valve replacement 05/25/2008   ??? Stable angina (HCC) 11/26/2007   ??? Abnormal cardiovascular stress test 11/26/2007   ??? Chronic anticoagulation 11/26/2007   ??? Cardiac pacemaker in situ 11/26/2007   ??? ??? Medtronic  ???   ??? Bradycardia 04/29/2007 ??? ??? Carotid Sinus Hypersensitivity  ???   ??? Numbness of arm 02/09/2006   ??? Aortic stenosis ???   ??? ??? AVR  ???   ??? Hyperlipidemia ???   ??? Gastritis ???   ??? Esophageal stricture ???   ???  ???  ???  Review of Systems   Constitution: Negative.   HENT: Negative.    Eyes: Negative.    Cardiovascular: Positive for claudication.   Respiratory: Negative.    Endocrine: Negative.    Hematologic/Lymphatic: Negative.    Skin: Negative.    Musculoskeletal: Negative.    Gastrointestinal: Negative.    Genitourinary: Negative.    Neurological: Negative.    Psychiatric/Behavioral: Positive for depression.   Allergic/Immunologic: Negative.    ???  ???  Physical Exam  Examination reveals an 85 year old gentleman obviously in some continued distress.  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  His aortic valve was for aortic stenosis and regurgitation.   ???  He has been mostly conservative management since that time.  He has no peripheral edema, cyanosis, or clubbing.  Pulses intact in the upper extremities bilaterally.  ???  ([id number])  ???  ???  ???  Cardiovascular Studies  He is currently approaching ERI and will need every-6-week checks until he reaches elective replacement.  ???  ([id number])  ???  ???  ???  Problems Addressed Today  No diagnosis found.  ???  Assessment and Plan  In summary, this is an 85 year old gentleman, on Coumadin for aortic valve replacement previously, also stent angioplasty, pacemaker for carotid sinus hypersensitivity, and now with a fall without syncope with some minor facial trauma, but with a significant right shoulder injury from which he has been in severe pain and with limited shoulder motion.  I am concerned that he may have had an unrecognized fracture.  He has not had x-rays.  We will do those as soon as possible, and he will need orthopedic consultation as well.  We will start with the x-rays.  ???  ([id number])  ???  ???  ???  ???  Current Medications (including today's revisions)  ??? MULTIVITAMIN PO Take 1 tablet by mouth daily.   ???  tamsulosin (FLOMAX) 0.4 mg capsule Take two capsules by mouth daily. Do not crush, chew or open capsules. Take 30 minutes following the same meal each day.   ??? warfarin (COUMADIN) 6 mg tablet TAKE 1 TO 2 TABLETS BY MOUTH ONCE DAILY AS  DIRECTED   ??? warfarin (COUMADIN) 7.5 mg tablet TAKE 1 TABLET BY MOUTH ONCE DAILY AS  DIRECTED  BY  CARDIOLOGY   ???

## 2018-08-05 NOTE — Progress Notes
Patient discharged to home with all belongings.  Discharge instructions, med reconciliation and home wound care instructions given and explained to patient and family both verbally and written.  Accompanied by son.  No complaints of pain or discomfort.   Left chest  remains clean, dry, and intact with no evidence of a hematoma after ambulation.  Patient escorted to lobby via Transport.  Patient to follow up with [facility]) or on-call physician with any additional questions or concerns.  All contact numbers provided.  Patient and family acceptant of DC instuctions and report understanding to all information.

## 2018-08-05 NOTE — Other
Cardiac Electrophysiology Brief Post-Procedure Note    Attending: Holland Commons, MD     EP FELLOW: Mayra Neer. 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  Intravenous prophylactic antibiotics were administered prior to the procedure.  After the site of implantation was prepped and drapped in the usual sterile fashion and after adequate anesthesia was given, the skin was infiltrated with local anesthetic.  The skin was incised with a #10 scalpel over the prior scar. Blunt and electrosurgical dissection was carried out to the pulse generator pocket. Of note there was some mild calcifications in the pocket when bluntly dissecting the device free.  Careful attention was paid not to damage the lead/leads.  The pulse generator was explanted and the leads were detached from the pulse generator.  The leads were tested for adequate sensing, impedances and pacing thresholds.  The leads were cleaned and dried thoroughly then attached to the appropriate ports on the new pulse generator after correctly identified from the serial numbers.  Tug testing was perfomed on all connections.  The pocket was copiously irrigated with normal saline containing antibiotics and subsequently observed.  Once adequate hemostasis was confirmed within the pocket, the device and leads were placed within  the pocket.  At this time it was difficult to place the generator in its previous position due to the way that the leads were laying in the pocket.  We needed to free up the proximal portion of the leads from the capsule in order to allow it to be placed in a more appropriate position within the pocket.  We placed all leads under the device and device header was positioned up in the pocket.     The pocket was closed with Continuous 2-0 Vicryl followed by continuous 2-0 Vicryl followed by 4-0 Monocryl. Steri-Strips were then applied over the incision followed by a sterile dressing. he tolerated the procedure well.    SEDATION: I was personally responsible for the administration of moderate sedation services during the procedure performed and I confirm requirements described in CPT section on moderate sedation were followed, including the use of an independent trained observer who had no other duties during the procedure. After providing fentanyl and midazolam, we achieved moderate conscious sedation, which was maintained throughout the procedure. The hemodynamic parameters, respiratory parameters, as well as neurological status was monitored throughout the procedure by the cath lab staff and myself. The total face-to-face time was 39 minutes (1544 to 1623).    -Please review full operative report for full details of the procedure    Anesthesia: Moderate sedation via RN     Estimated blood loss: 5 mL    Recommendations: (device)  - Pain control as needed  - No need for repeat device interrogation, ECG, or Chest X-Ray (generator change only)  - Keflex 500 mg PO tid x 3 days    - No heparin or LMWH products for 72 hours post-procedure  - Incision Check in one week    - Follow-up:  Dr. Naoma Diener in 3 months     -Dr. Holland Commons was present throughout the entire procedure.    Batul Diego A. Dareen Piano, DO  Cardiac Electrophysiology Fellow, PGY-7  Contact via [employer] or Pager #: [phone]  4:33 PM, 08/05/2018

## 2018-08-06 ENCOUNTER — Encounter: Admit: 2018-08-06 | Discharge: 2018-08-06

## 2018-08-12 ENCOUNTER — Encounter: Admit: 2018-08-12 | Discharge: 2018-08-12

## 2018-08-12 NOTE — Progress Notes
Removed Silverlon/Aquacel dressing. (Left or right) prepectoral incision is clean, dry, well approximated, and healing without evidence of drainage or discharge. Incision was closed using Dermabond and is dry and intact. Pt reports no adverse symptoms. Incision care, including signs and symptoms of infection, and Dermabond healing process reviewed(as below). Pt verbalized understanding and will remain in phone contact.    AVOID TOPICAL MEDICATIONS   Do not apply liquid or ointment medications or any other product to your wound while the  DERMABOND adhesive film is in place. These may loosen the film before your wound is healed.  KEEP WOUND DRY AND PROTECTED   You may occasionally and briefly wet your wound in the shower or bath. Do not soak or scrub  your wound, do not swim, and avoid periods of heavy perspiration until the DERMABOND  adhesive has naturally fallen off. After showering or bathing, gently blot your wound dry with a  soft towel. If a protective dressing is being used, apply a fresh, dry bandage, being sure to keep  the tape off the DERMABOND adhesive film.   Apply a clean, dry bandage over the wound if necessary to protect it.   Protect your wound from injury until the skin has had sufficient time to heal.   Do not scratch, rub, or pick at the DERMABOND adhesive film. This may loosen the film before  your wound is healed.   Protect the wound from prolonged exposure to sunlight or tanning lamps while the film is in  place.    Your incision should gradually look better each day. Please notify our office immediately if you notice any of the following:   -an increase in swelling or redness   -any drainage   -increasing pain at the incision site  -fever over 100 degrees or chills

## 2018-08-12 NOTE — Telephone Encounter
-----   Message from Ilean Skill, APRN-NP sent at 08/05/2018  3:52 PM CDT -----  Regarding: One week incision check. Phone call/picture due.  One week incision check. Phone call/picture due.

## 2018-08-12 NOTE — Telephone Encounter
I called and spoke to pt. He said that he has no way to take a picture of his incision and he would like to go to the [place] office for his incision check. Pt scheduled for 2pm today. Pt notified.

## 2018-08-18 ENCOUNTER — Encounter: Admit: 2018-08-18 | Discharge: 2018-08-18

## 2018-08-18 NOTE — Progress Notes
Left msg for pt at preferred # re: INR results. Pt to continue current therapeutic dose and recheck in ~ 2 weeks. Left cardiology [facility] nurse line # for call back prn. No further needs identified at this time.

## 2018-09-01 ENCOUNTER — Encounter: Admit: 2018-09-01 | Discharge: 2018-09-01

## 2018-09-01 DIAGNOSIS — I6529 Occlusion and stenosis of unspecified carotid artery: Secondary | ICD-10-CM

## 2018-09-01 DIAGNOSIS — Z7901 Long term (current) use of anticoagulants: Secondary | ICD-10-CM

## 2018-09-01 DIAGNOSIS — I2510 Atherosclerotic heart disease of native coronary artery without angina pectoris: Secondary | ICD-10-CM

## 2018-09-01 DIAGNOSIS — Z952 Presence of prosthetic heart valve: Secondary | ICD-10-CM

## 2018-09-01 DIAGNOSIS — K222 Esophageal obstruction: Secondary | ICD-10-CM

## 2018-09-01 DIAGNOSIS — E785 Hyperlipidemia, unspecified: Secondary | ICD-10-CM

## 2018-09-01 DIAGNOSIS — K2970 Gastritis, unspecified, without bleeding: Secondary | ICD-10-CM

## 2018-09-01 DIAGNOSIS — G459 Transient cerebral ischemic attack, unspecified: Secondary | ICD-10-CM

## 2018-09-01 DIAGNOSIS — I350 Nonrheumatic aortic (valve) stenosis: Secondary | ICD-10-CM

## 2018-09-01 DIAGNOSIS — I779 Disorder of arteries and arterioles, unspecified: Secondary | ICD-10-CM

## 2018-09-01 NOTE — Progress Notes
INR 2.5, LMOM to con't same dose and recheck in 2 weeks. tc

## 2018-09-15 ENCOUNTER — Encounter: Admit: 2018-09-15 | Discharge: 2018-09-15

## 2018-09-15 DIAGNOSIS — I350 Nonrheumatic aortic (valve) stenosis: Secondary | ICD-10-CM

## 2018-09-15 LAB — PROTIME INR (PT): Lab: 2.5

## 2018-10-01 ENCOUNTER — Encounter: Admit: 2018-10-01 | Discharge: 2018-10-01 | Payer: MEDICARE

## 2018-10-01 LAB — PROTIME INR (PT): Lab: 2.5

## 2018-10-13 ENCOUNTER — Encounter: Admit: 2018-10-13 | Discharge: 2018-10-13 | Payer: MEDICARE

## 2018-10-27 ENCOUNTER — Encounter: Admit: 2018-10-27 | Discharge: 2018-10-27 | Payer: MEDICARE

## 2018-10-27 DIAGNOSIS — Z7901 Long term (current) use of anticoagulants: Secondary | ICD-10-CM

## 2018-10-27 DIAGNOSIS — I350 Nonrheumatic aortic (valve) stenosis: Secondary | ICD-10-CM

## 2018-10-27 DIAGNOSIS — Z952 Presence of prosthetic heart valve: Secondary | ICD-10-CM

## 2018-10-27 LAB — PROTIME INR (PT): Lab: 2.8

## 2018-11-10 ENCOUNTER — Encounter: Admit: 2018-11-10 | Discharge: 2018-11-10 | Payer: MEDICARE

## 2018-11-10 DIAGNOSIS — Z952 Presence of prosthetic heart valve: Secondary | ICD-10-CM

## 2018-11-10 DIAGNOSIS — Z7901 Long term (current) use of anticoagulants: Secondary | ICD-10-CM

## 2018-11-10 DIAGNOSIS — I350 Nonrheumatic aortic (valve) stenosis: Secondary | ICD-10-CM

## 2018-11-10 NOTE — Progress Notes
11/10/2018 11:19 AM INR within range.  Left detailed voicemail for patient.  Continue current warfarin dosing and repeat INR on 11/24/18.  Reminded patient that he is due for OV with Dr. Aris Georgia.  Asked patient to please call back to schedule an appointment.  Jacklynn Barnacle, RN

## 2018-11-24 ENCOUNTER — Encounter: Admit: 2018-11-24 | Discharge: 2018-11-24 | Payer: MEDICARE

## 2018-11-24 DIAGNOSIS — Z7901 Long term (current) use of anticoagulants: Secondary | ICD-10-CM

## 2018-11-24 DIAGNOSIS — Z952 Presence of prosthetic heart valve: Secondary | ICD-10-CM

## 2018-11-24 DIAGNOSIS — I350 Nonrheumatic aortic (valve) stenosis: Secondary | ICD-10-CM

## 2018-11-24 LAB — PROTIME INR (PT): Lab: 2.5

## 2018-11-24 NOTE — Progress Notes
11/24/2018 1:31 PM   Mr Woolsey will continue same dose and recheck INR in 2 weeks.  I scheduled him for follow up in January with dr Aris Georgia at the [place] office. Gaynelle Cage, RN

## 2018-12-08 ENCOUNTER — Encounter: Admit: 2018-12-08 | Discharge: 2018-12-08 | Payer: MEDICARE

## 2018-12-08 DIAGNOSIS — Z952 Presence of prosthetic heart valve: Secondary | ICD-10-CM

## 2018-12-08 DIAGNOSIS — Z7901 Long term (current) use of anticoagulants: Secondary | ICD-10-CM

## 2018-12-08 NOTE — Progress Notes
11/25-INR 2.7. Pt denies any changes in diet or medications, reports no bleeding or other issues. Dosage of warfarin verified, pt verbalized understanding of instructions and when next INR is due to be drawn.

## 2019-01-05 ENCOUNTER — Encounter: Admit: 2019-01-05 | Discharge: 2019-01-05 | Payer: MEDICARE

## 2019-01-05 DIAGNOSIS — Z952 Presence of prosthetic heart valve: Secondary | ICD-10-CM

## 2019-01-05 DIAGNOSIS — Z7901 Long term (current) use of anticoagulants: Secondary | ICD-10-CM

## 2019-01-05 NOTE — Progress Notes
01/05/2019 9:09AM-INR 2.7. Called and LVM confirming INR results and instructions to continue with same warfarin dosing and recheck INR in approx. 2 weeks. Encouraged to return call for any concerns or need for clarification as needed.

## 2019-01-11 ENCOUNTER — Encounter: Admit: 2019-01-11 | Discharge: 2019-01-11 | Payer: MEDICARE

## 2019-01-11 NOTE — Progress Notes
Records Request    Medical records request for continuation of care:    Patient has appointment on 1.5.2021   with  Dr. Aris Georgia .    Please fax records to [facility] [place]    Request records:      Recent Labs        Thank you,      Cardiovascular Medicine  [facility]  [address] [place] [address]  Phone:  [phone]  Fax:  [phone]

## 2019-01-18 ENCOUNTER — Encounter: Admit: 2019-01-18 | Discharge: 2019-01-18 | Payer: MEDICARE

## 2019-01-18 DIAGNOSIS — K2970 Gastritis, unspecified, without bleeding: Secondary | ICD-10-CM

## 2019-01-18 DIAGNOSIS — I350 Nonrheumatic aortic (valve) stenosis: Secondary | ICD-10-CM

## 2019-01-18 DIAGNOSIS — G459 Transient cerebral ischemic attack, unspecified: Secondary | ICD-10-CM

## 2019-01-18 DIAGNOSIS — I2510 Atherosclerotic heart disease of native coronary artery without angina pectoris: Secondary | ICD-10-CM

## 2019-01-18 DIAGNOSIS — E785 Hyperlipidemia, unspecified: Secondary | ICD-10-CM

## 2019-01-18 DIAGNOSIS — I6529 Occlusion and stenosis of unspecified carotid artery: Secondary | ICD-10-CM

## 2019-01-18 DIAGNOSIS — I779 Disorder of arteries and arterioles, unspecified: Secondary | ICD-10-CM

## 2019-01-18 DIAGNOSIS — K222 Esophageal obstruction: Secondary | ICD-10-CM

## 2019-01-18 DIAGNOSIS — M25552 Pain in left hip: Secondary | ICD-10-CM

## 2019-02-04 ENCOUNTER — Encounter: Admit: 2019-02-04 | Discharge: 2019-02-04 | Payer: MEDICARE

## 2019-02-04 MED ORDER — WARFARIN 7.5 MG PO TAB
ORAL_TABLET | Freq: Every day | ORAL | 3 refills | 90.00000 days | Status: AC
Start: 2019-02-04 — End: ?

## 2019-02-14 ENCOUNTER — Encounter: Admit: 2019-02-14 | Discharge: 2019-02-14 | Payer: MEDICARE

## 2019-02-14 DIAGNOSIS — I350 Nonrheumatic aortic (valve) stenosis: Secondary | ICD-10-CM

## 2019-02-14 DIAGNOSIS — Z952 Presence of prosthetic heart valve: Secondary | ICD-10-CM

## 2019-02-14 NOTE — Progress Notes
Records Request    Medical records request for continuation of care:    Patient has appointment on 02/17/2019   with  Dr. Larwance Sachs* .    Please fax records to Cardiovascular Medicine [facility] [phone]    Request records:STAT          MRI- Lower Extremity Joint            Thank you,      Cardiovascular Medicine  [facility]  [address]  [place], [place] [address]  Phone:  [phone]  Fax:  [phone]

## 2019-02-15 ENCOUNTER — Encounter: Admit: 2019-02-15 | Discharge: 2019-02-15 | Payer: MEDICARE

## 2019-02-15 DIAGNOSIS — R001 Bradycardia, unspecified: Secondary | ICD-10-CM

## 2019-02-15 DIAGNOSIS — Z952 Presence of prosthetic heart valve: Secondary | ICD-10-CM

## 2019-02-15 DIAGNOSIS — Z7901 Long term (current) use of anticoagulants: Secondary | ICD-10-CM

## 2019-02-15 DIAGNOSIS — I350 Nonrheumatic aortic (valve) stenosis: Secondary | ICD-10-CM

## 2019-02-15 NOTE — Progress Notes
02/15/2019 3:20 PM  INR 4.7.  Was on a steroid, but finished a couple of days ago.  He has already taken his warfarin dose for today.  flowsheet updated to what pt is taking and actual dose reduced.  He is going to start going to the lab and no longer going to do home INR's.  Pt/LL

## 2019-02-17 ENCOUNTER — Encounter: Admit: 2019-02-17 | Discharge: 2019-02-17 | Payer: MEDICARE

## 2019-02-17 DIAGNOSIS — I350 Nonrheumatic aortic (valve) stenosis: Secondary | ICD-10-CM

## 2019-02-17 DIAGNOSIS — G459 Transient cerebral ischemic attack, unspecified: Secondary | ICD-10-CM

## 2019-02-17 DIAGNOSIS — Z952 Presence of prosthetic heart valve: Secondary | ICD-10-CM

## 2019-02-17 DIAGNOSIS — I779 Disorder of arteries and arterioles, unspecified: Secondary | ICD-10-CM

## 2019-02-17 DIAGNOSIS — M24152 Other articular cartilage disorders, left hip: Secondary | ICD-10-CM

## 2019-02-17 DIAGNOSIS — K2970 Gastritis, unspecified, without bleeding: Secondary | ICD-10-CM

## 2019-02-17 DIAGNOSIS — I2510 Atherosclerotic heart disease of native coronary artery without angina pectoris: Secondary | ICD-10-CM

## 2019-02-17 DIAGNOSIS — E785 Hyperlipidemia, unspecified: Secondary | ICD-10-CM

## 2019-02-17 DIAGNOSIS — K222 Esophageal obstruction: Secondary | ICD-10-CM

## 2019-02-17 DIAGNOSIS — I6529 Occlusion and stenosis of unspecified carotid artery: Secondary | ICD-10-CM

## 2019-02-17 NOTE — Patient Instructions
REFERRAL TO ORTHOPEDICS FAXED.  WE WILL CALL YOU WITH AN APPT TIME.

## 2019-02-17 NOTE — Progress Notes
Left message for [facility] in [place] requesting ASAP appt for further evaluation and tx of L labral tear.  Referral fax with copy of MRI hip.  Await return call.

## 2019-02-18 ENCOUNTER — Encounter: Admit: 2019-02-18 | Discharge: 2019-02-18 | Payer: MEDICARE

## 2019-03-08 ENCOUNTER — Encounter: Admit: 2019-03-08 | Discharge: 2019-03-08 | Payer: MEDICARE

## 2019-03-08 ENCOUNTER — Ambulatory Visit: Admit: 2019-03-08 | Discharge: 2019-03-08 | Payer: MEDICARE

## 2019-03-08 DIAGNOSIS — Z952 Presence of prosthetic heart valve: Secondary | ICD-10-CM

## 2019-03-08 DIAGNOSIS — Z7901 Long term (current) use of anticoagulants: Secondary | ICD-10-CM

## 2019-03-08 DIAGNOSIS — R001 Bradycardia, unspecified: Secondary | ICD-10-CM

## 2019-03-08 DIAGNOSIS — Z950 Presence of cardiac pacemaker: Secondary | ICD-10-CM

## 2019-03-15 ENCOUNTER — Encounter: Admit: 2019-03-15 | Discharge: 2019-03-15 | Payer: MEDICARE

## 2019-03-15 NOTE — Telephone Encounter
Received call from Dr. Marykay Lex office with [facility] [phone]).  They are calling to request approval from Dr. Geronimo Boot to hold warfarin for upcoming lumbar injections.  They are planning 3 injections and need to hold warfarin for 6 days prior to each injection.     Will discuss with TLR in clinic on 3/3 and will call his recommendations to Lawson Fiscal, Charity fundraiser at [facility].  She is agreeable to plan and will await our recommendations prior to scheduling injections.

## 2019-04-15 ENCOUNTER — Encounter: Admit: 2019-04-15 | Discharge: 2019-04-15 | Payer: MEDICARE

## 2019-04-15 NOTE — Progress Notes
INR Overdue.  Called and left message requesting pt to have INR drawn at earliest convenience.  Left callback number for any questions or concerns.

## 2019-05-16 ENCOUNTER — Encounter: Admit: 2019-05-16 | Discharge: 2019-05-16 | Payer: MEDICARE

## 2019-05-16 DIAGNOSIS — I350 Nonrheumatic aortic (valve) stenosis: Secondary | ICD-10-CM

## 2019-05-16 DIAGNOSIS — Z952 Presence of prosthetic heart valve: Secondary | ICD-10-CM

## 2019-06-14 ENCOUNTER — Encounter: Admit: 2019-06-14 | Discharge: 2019-06-14 | Payer: MEDICARE

## 2019-06-14 DIAGNOSIS — I350 Nonrheumatic aortic (valve) stenosis: Secondary | ICD-10-CM

## 2019-06-14 DIAGNOSIS — Z952 Presence of prosthetic heart valve: Secondary | ICD-10-CM

## 2019-06-14 LAB — PROTIME INR (PT): Lab: 2.5

## 2019-07-15 ENCOUNTER — Encounter: Admit: 2019-07-15 | Discharge: 2019-07-15 | Payer: MEDICARE

## 2019-07-15 DIAGNOSIS — Z952 Presence of prosthetic heart valve: Secondary | ICD-10-CM

## 2019-07-15 DIAGNOSIS — I350 Nonrheumatic aortic (valve) stenosis: Secondary | ICD-10-CM

## 2019-08-05 ENCOUNTER — Encounter: Admit: 2019-08-05 | Discharge: 2019-08-05 | Payer: MEDICARE

## 2019-08-05 ENCOUNTER — Ambulatory Visit: Admit: 2019-08-05 | Discharge: 2019-08-05 | Payer: MEDICARE

## 2019-08-05 DIAGNOSIS — R55 Syncope and collapse: Secondary | ICD-10-CM

## 2019-08-06 ENCOUNTER — Encounter: Admit: 2019-08-06 | Discharge: 2019-08-06 | Payer: MEDICARE

## 2019-08-06 NOTE — Telephone Encounter
Victorino Dike  w/[facility]) called asking if pt's device is MRI compatible. Cb# [phone].   Victorino Dike states that Pt was admitted w/ dizziness, is hypertensive w/ SBP 190s, NSR, labs are good.   Their CT scanner is down at this time.   I let her know pt has Medtronic Azure XT DR MRI Surescan device implanted 08/05/2018

## 2019-08-16 ENCOUNTER — Encounter: Admit: 2019-08-16 | Discharge: 2019-08-16 | Payer: MEDICARE

## 2019-08-16 DIAGNOSIS — Z952 Presence of prosthetic heart valve: Secondary | ICD-10-CM

## 2019-08-16 DIAGNOSIS — I350 Nonrheumatic aortic (valve) stenosis: Secondary | ICD-10-CM

## 2019-09-13 ENCOUNTER — Encounter: Admit: 2019-09-13 | Discharge: 2019-09-13 | Payer: MEDICARE

## 2019-09-13 DIAGNOSIS — Z952 Presence of prosthetic heart valve: Secondary | ICD-10-CM

## 2019-09-13 DIAGNOSIS — I350 Nonrheumatic aortic (valve) stenosis: Secondary | ICD-10-CM

## 2019-10-14 ENCOUNTER — Encounter: Admit: 2019-10-14 | Discharge: 2019-10-14 | Payer: MEDICARE

## 2019-10-14 DIAGNOSIS — Z952 Presence of prosthetic heart valve: Secondary | ICD-10-CM

## 2019-10-14 DIAGNOSIS — I350 Nonrheumatic aortic (valve) stenosis: Secondary | ICD-10-CM

## 2019-10-28 ENCOUNTER — Encounter: Admit: 2019-10-28 | Discharge: 2019-10-28 | Payer: MEDICARE

## 2019-10-28 NOTE — Telephone Encounter
Left msg for pt at home # re: overdue INR and asked him to have INR checked by next week and/or call us with an updated. Left cardiology [facility] nurse line # for call back prn. No further needs identified at this time.    ----- Message -----  From: Rogelia Boga, RN  Sent: 10/21/2019  To: Bridget Hartshorn Nurse [place]  Subject: INR Due 10/21/19                                  Elige Radon is due for an INR test on 10/21/19.

## 2019-11-14 ENCOUNTER — Encounter: Admit: 2019-11-14 | Discharge: 2019-11-14 | Payer: MEDICARE

## 2019-11-14 DIAGNOSIS — I350 Nonrheumatic aortic (valve) stenosis: Secondary | ICD-10-CM

## 2019-11-14 DIAGNOSIS — Z952 Presence of prosthetic heart valve: Secondary | ICD-10-CM

## 2019-11-14 LAB — PROTIME INR (PT): Lab: 2.2

## 2019-11-21 ENCOUNTER — Encounter: Admit: 2019-11-21 | Discharge: 2019-11-21 | Payer: MEDICARE

## 2019-11-21 MED ORDER — WARFARIN 6 MG PO TAB
ORAL_TABLET | Freq: Every day | ORAL | 3 refills | 90.00000 days | Status: AC
Start: 2019-11-21 — End: ?

## 2019-11-30 ENCOUNTER — Encounter: Admit: 2019-11-30 | Discharge: 2019-11-30 | Payer: MEDICARE

## 2019-11-30 NOTE — Telephone Encounter
Left msg for pt at home # re: overdue INR and asked him to have INR checked by next week and/or call us with an updated. Left cardiology [facility] nurse line # for call back prn. No further needs identified at this time.    ----- Message -----  From: Lauralee Evener, RN  Sent: 11/21/2019  To: Bridget Hartshorn Nurse [place]  Subject: INR Due 11/21/19                                  Dominic Olsen is due for an INR test on 11/21/19.

## 2019-12-14 ENCOUNTER — Encounter: Admit: 2019-12-14 | Discharge: 2019-12-14 | Payer: MEDICARE

## 2019-12-14 DIAGNOSIS — I350 Nonrheumatic aortic (valve) stenosis: Secondary | ICD-10-CM

## 2019-12-14 DIAGNOSIS — Z952 Presence of prosthetic heart valve: Secondary | ICD-10-CM

## 2019-12-14 LAB — PROTIME INR (PT): Lab: 2.1

## 2019-12-14 NOTE — Telephone Encounter
Left message for patient to call with an updated INR result that's overdue.Instructed patient to call back with results and for any other concerns. Phone number provided.

## 2020-01-16 ENCOUNTER — Encounter: Admit: 2020-01-16 | Discharge: 2020-01-16 | Payer: MEDICARE

## 2020-01-16 DIAGNOSIS — Z952 Presence of prosthetic heart valve: Secondary | ICD-10-CM

## 2020-01-16 DIAGNOSIS — Z7901 Long term (current) use of anticoagulants: Secondary | ICD-10-CM

## 2020-01-17 ENCOUNTER — Encounter: Admit: 2020-01-17 | Discharge: 2020-01-17 | Payer: MEDICARE

## 2020-01-17 MED ORDER — TAMSULOSIN 0.4 MG PO CAP
.8 mg | ORAL_CAPSULE | Freq: Every day | ORAL | 0 refills | 90.00000 days | Status: AC
Start: 2020-01-17 — End: ?

## 2020-02-14 ENCOUNTER — Encounter: Admit: 2020-02-14 | Discharge: 2020-02-14 | Payer: MEDICARE

## 2020-02-14 DIAGNOSIS — Z7901 Long term (current) use of anticoagulants: Secondary | ICD-10-CM

## 2020-02-14 DIAGNOSIS — Z952 Presence of prosthetic heart valve: Secondary | ICD-10-CM

## 2020-02-14 LAB — PROTIME INR (PT): Lab: 2.9

## 2020-03-15 ENCOUNTER — Encounter: Admit: 2020-03-15 | Discharge: 2020-03-15 | Payer: MEDICARE

## 2020-03-15 DIAGNOSIS — Z952 Presence of prosthetic heart valve: Secondary | ICD-10-CM

## 2020-03-15 DIAGNOSIS — Z7901 Long term (current) use of anticoagulants: Secondary | ICD-10-CM

## 2020-03-22 ENCOUNTER — Encounter: Admit: 2020-03-22 | Discharge: 2020-03-22 | Payer: MEDICARE

## 2020-03-22 DIAGNOSIS — Z7901 Long term (current) use of anticoagulants: Secondary | ICD-10-CM

## 2020-03-22 DIAGNOSIS — Z952 Presence of prosthetic heart valve: Secondary | ICD-10-CM

## 2020-03-29 ENCOUNTER — Encounter: Admit: 2020-03-29 | Discharge: 2020-03-29 | Payer: MEDICARE

## 2020-03-29 ENCOUNTER — Ambulatory Visit: Admit: 2020-03-29 | Discharge: 2020-03-29 | Payer: MEDICARE

## 2020-03-29 DIAGNOSIS — Z952 Presence of prosthetic heart valve: Secondary | ICD-10-CM

## 2020-03-29 DIAGNOSIS — Z950 Presence of cardiac pacemaker: Secondary | ICD-10-CM

## 2020-03-29 DIAGNOSIS — I350 Nonrheumatic aortic (valve) stenosis: Secondary | ICD-10-CM

## 2020-03-29 DIAGNOSIS — I2510 Atherosclerotic heart disease of native coronary artery without angina pectoris: Secondary | ICD-10-CM

## 2020-03-29 NOTE — Telephone Encounter
-----   Message from Thayer Headings, RN sent at 03/29/2020  1:35 PM CDT -----  Regarding: FW: Newly noted AF with RVR on pt's device today. Also device showing NSVT last fall <2 second duration.  I didn't realize this was a [facility] pt. I forwarded the msg to Dr. Geronimo Boot but I know he isn't very active on IB. Sorry for the delay.    Thanks,  Ryann Biomedical engineer RN  ----- Message -----  From: Cheri Guppy, RN  Sent: 03/29/2020  10:53 AM CDT  To: Richarda Blade, #  Subject: Newly noted AF with RVR on pt's device today#    Please review with Dr. Geronimo Boot.     New onset AT/AF with RVR seen on annual ppm check.  The pt is set to be seen Monday afternoon, on warfarin for aortic valve, but need to close the loop this is noted and added to pt's problem list.   He also has new NSVT seen on check. Please review report for details.     Would like to see if he wants to turn on ATP to treat the arrhythmias? May decrease time in arrhythmia. Currently, it is programed off.     Please review and respond when update received.     Thank you,   Victorino Dike

## 2020-03-29 NOTE — Telephone Encounter
Discussed with Dr. Geronimo Boot.  He would like to potential start patient on digoxin for rhythm control, but wants to check labs first.  He would like a BMP and Mag.  Faxed orders to [facility].    Discussed with patient.  He is agreeable to plan.  He will go get labs checked tomorrow in [place].

## 2020-03-30 ENCOUNTER — Encounter: Admit: 2020-03-30 | Discharge: 2020-03-30 | Payer: MEDICARE

## 2020-03-30 DIAGNOSIS — I2510 Atherosclerotic heart disease of native coronary artery without angina pectoris: Secondary | ICD-10-CM

## 2020-03-30 DIAGNOSIS — I350 Nonrheumatic aortic (valve) stenosis: Secondary | ICD-10-CM

## 2020-03-30 DIAGNOSIS — Z952 Presence of prosthetic heart valve: Secondary | ICD-10-CM

## 2020-03-30 LAB — BASIC METABOLIC PANEL
Lab: 1.1
Lab: 109 — ABNORMAL HIGH (ref 98–107)
Lab: 140
Lab: 15 — ABNORMAL HIGH (ref 0–14)
Lab: 21 — ABNORMAL LOW (ref 23–31)
Lab: 24
Lab: 9.9
Lab: 99

## 2020-03-30 LAB — MAGNESIUM: Lab: 2

## 2020-04-03 ENCOUNTER — Encounter: Admit: 2020-04-03 | Discharge: 2020-04-03 | Payer: MEDICARE

## 2020-04-03 DIAGNOSIS — Z45010 Encounter for checking and testing of cardiac pacemaker pulse generator [battery]: Secondary | ICD-10-CM

## 2020-04-03 DIAGNOSIS — I350 Nonrheumatic aortic (valve) stenosis: Secondary | ICD-10-CM

## 2020-04-03 DIAGNOSIS — E785 Hyperlipidemia, unspecified: Secondary | ICD-10-CM

## 2020-04-03 DIAGNOSIS — R9439 Abnormal result of other cardiovascular function study: Secondary | ICD-10-CM

## 2020-04-03 DIAGNOSIS — K222 Esophageal obstruction: Secondary | ICD-10-CM

## 2020-04-03 DIAGNOSIS — K2970 Gastritis, unspecified, without bleeding: Secondary | ICD-10-CM

## 2020-04-03 DIAGNOSIS — I2510 Atherosclerotic heart disease of native coronary artery without angina pectoris: Secondary | ICD-10-CM

## 2020-04-03 DIAGNOSIS — R001 Bradycardia, unspecified: Secondary | ICD-10-CM

## 2020-04-03 DIAGNOSIS — G459 Transient cerebral ischemic attack, unspecified: Secondary | ICD-10-CM

## 2020-04-03 DIAGNOSIS — I6529 Occlusion and stenosis of unspecified carotid artery: Secondary | ICD-10-CM

## 2020-04-03 DIAGNOSIS — I10 Essential (primary) hypertension: Secondary | ICD-10-CM

## 2020-04-03 DIAGNOSIS — I779 Disorder of arteries and arterioles, unspecified: Secondary | ICD-10-CM

## 2020-04-04 ENCOUNTER — Encounter: Admit: 2020-04-04 | Discharge: 2020-04-04 | Payer: MEDICARE

## 2020-04-04 DIAGNOSIS — Z45010 Encounter for checking and testing of cardiac pacemaker pulse generator [battery]: Secondary | ICD-10-CM

## 2020-04-04 DIAGNOSIS — R9439 Abnormal result of other cardiovascular function study: Secondary | ICD-10-CM

## 2020-04-04 DIAGNOSIS — I10 Essential (primary) hypertension: Secondary | ICD-10-CM

## 2020-04-04 DIAGNOSIS — R001 Bradycardia, unspecified: Secondary | ICD-10-CM

## 2020-04-13 ENCOUNTER — Encounter: Admit: 2020-04-13 | Discharge: 2020-04-13 | Payer: MEDICARE

## 2020-04-13 DIAGNOSIS — Z952 Presence of prosthetic heart valve: Secondary | ICD-10-CM

## 2020-04-13 DIAGNOSIS — Z7901 Long term (current) use of anticoagulants: Secondary | ICD-10-CM

## 2020-04-13 LAB — PROTIME INR (PT): Lab: 3

## 2020-04-23 ENCOUNTER — Encounter: Admit: 2020-04-23 | Discharge: 2020-04-23 | Payer: MEDICARE

## 2020-04-23 MED ORDER — WARFARIN 7.5 MG PO TAB
ORAL_TABLET | Freq: Every day | ORAL | 3 refills | 90.00000 days | Status: AC
Start: 2020-04-23 — End: ?

## 2020-05-15 ENCOUNTER — Encounter: Admit: 2020-05-15 | Discharge: 2020-05-15 | Payer: MEDICARE

## 2020-05-18 ENCOUNTER — Encounter: Admit: 2020-05-18 | Discharge: 2020-05-18 | Payer: MEDICARE

## 2020-05-18 DIAGNOSIS — Z7901 Long term (current) use of anticoagulants: Secondary | ICD-10-CM

## 2020-05-18 DIAGNOSIS — Z952 Presence of prosthetic heart valve: Secondary | ICD-10-CM

## 2020-05-18 LAB — PROTIME INR (PT): INR: 4.3 — ABNORMAL HIGH (ref 0.89–1.11)

## 2020-05-24 ENCOUNTER — Encounter: Admit: 2020-05-24 | Discharge: 2020-05-24 | Payer: MEDICARE

## 2020-05-24 DIAGNOSIS — Z7901 Long term (current) use of anticoagulants: Secondary | ICD-10-CM

## 2020-05-24 DIAGNOSIS — Z952 Presence of prosthetic heart valve: Secondary | ICD-10-CM

## 2020-06-06 ENCOUNTER — Encounter: Admit: 2020-06-06 | Discharge: 2020-06-06 | Payer: MEDICARE

## 2020-06-06 NOTE — Telephone Encounter
Called and spoke with patient regarding overdue INR. Patient states that he has been sick and not feeling well. Patient will get INR checked at earliest convenience.

## 2020-06-13 ENCOUNTER — Encounter: Admit: 2020-06-13 | Discharge: 2020-06-13 | Payer: MEDICARE

## 2020-06-13 DIAGNOSIS — Z7901 Long term (current) use of anticoagulants: Secondary | ICD-10-CM

## 2020-06-13 DIAGNOSIS — Z952 Presence of prosthetic heart valve: Secondary | ICD-10-CM

## 2020-07-13 ENCOUNTER — Encounter: Admit: 2020-07-13 | Discharge: 2020-07-13 | Payer: MEDICARE

## 2020-07-13 DIAGNOSIS — Z952 Presence of prosthetic heart valve: Secondary | ICD-10-CM

## 2020-07-13 DIAGNOSIS — Z7901 Long term (current) use of anticoagulants: Secondary | ICD-10-CM

## 2020-07-13 LAB — PROTIME INR (PT): INR: 5.1 — ABNORMAL HIGH

## 2020-07-13 NOTE — Progress Notes
07/13/2020 4:51 PM INR Critical. Discussed with TLR he recommends patient to hold coumadin for 2 days then half dose 1 day and recheck INR on Tuesday 07/17/20. Discussed care plan with patient. Patient states that he has been taking 6mg  X 4 days a week and 7.5mg  X 3 days a week. Instructed patient to decrease 7.5mg  dose to 2 days a week and 6mg  5 days a week. , RN

## 2020-07-17 ENCOUNTER — Encounter: Admit: 2020-07-17 | Discharge: 2020-07-17 | Payer: MEDICARE

## 2020-07-17 DIAGNOSIS — Z952 Presence of prosthetic heart valve: Secondary | ICD-10-CM

## 2020-07-17 DIAGNOSIS — Z7901 Long term (current) use of anticoagulants: Secondary | ICD-10-CM

## 2020-07-17 LAB — PROTIME INR (PT): INR: 2

## 2020-07-24 ENCOUNTER — Encounter: Admit: 2020-07-24 | Discharge: 2020-07-24 | Payer: MEDICARE

## 2020-07-24 DIAGNOSIS — Z7901 Long term (current) use of anticoagulants: Secondary | ICD-10-CM

## 2020-07-24 DIAGNOSIS — Z952 Presence of prosthetic heart valve: Secondary | ICD-10-CM

## 2020-07-24 LAB — PROTIME INR (PT): INR: 2.1

## 2020-08-13 ENCOUNTER — Encounter: Admit: 2020-08-13 | Discharge: 2020-08-13 | Payer: MEDICARE

## 2020-08-13 DIAGNOSIS — Z7901 Long term (current) use of anticoagulants: Secondary | ICD-10-CM

## 2020-08-13 DIAGNOSIS — Z952 Presence of prosthetic heart valve: Secondary | ICD-10-CM

## 2020-09-04 ENCOUNTER — Ambulatory Visit: Admit: 2020-09-04 | Discharge: 2020-09-04 | Payer: MEDICARE

## 2020-09-04 ENCOUNTER — Encounter: Admit: 2020-09-04 | Discharge: 2020-09-04 | Payer: MEDICARE

## 2020-09-04 DIAGNOSIS — Z952 Presence of prosthetic heart valve: Secondary | ICD-10-CM

## 2020-09-04 DIAGNOSIS — R001 Bradycardia, unspecified: Secondary | ICD-10-CM

## 2020-09-05 ENCOUNTER — Encounter: Admit: 2020-09-05 | Discharge: 2020-09-05 | Payer: MEDICARE

## 2020-09-05 NOTE — Telephone Encounter
-----   Message from Ishmael Holter, RN sent at 09/04/2020  4:30 PM CDT -----  Regarding: FW: Please have TLR/provider review NSVT seen on ppm check today in clinic.    ----- Message -----  From: Cheri Guppy, RN  Sent: 09/04/2020   4:01 PM CDT  To: [facility] Nurse Gen Card Team [place]  Subject: Please have TLR/provider review NSVT seen on#    Please and thank you.

## 2020-09-05 NOTE — Telephone Encounter
Called and discussed symptoms with patient. Patient states that he has not had any new symptoms at this time. Denies any shortness of breath, palpitations, or chest pain. Instructed patient to call nursing line with any issues or questions.       Discussed device report with Dr. Sandria Manly in clinic. He recommends since patient is asymptomatic that he should continue to monitor symptoms and follow up with Dr. Geronimo Boot.         Patient is scheduled with TLR on 11/15 called and left message with recommendations. Left call back number for any questions, comments, or concerns.

## 2020-09-12 ENCOUNTER — Encounter: Admit: 2020-09-12 | Discharge: 2020-09-12 | Payer: MEDICARE

## 2020-09-12 DIAGNOSIS — Z7901 Long term (current) use of anticoagulants: Secondary | ICD-10-CM

## 2020-09-12 DIAGNOSIS — Z952 Presence of prosthetic heart valve: Secondary | ICD-10-CM

## 2020-09-12 LAB — PROTIME INR (PT): INR: 2.9 MMOL/L (ref 98–110)

## 2020-09-21 ENCOUNTER — Encounter: Admit: 2020-09-21 | Discharge: 2020-09-21 | Payer: MEDICARE

## 2020-09-21 MED ORDER — DIGOXIN 125 MCG (0.125 MG) PO TAB
ORAL_TABLET | Freq: Every day | ORAL | 3 refills | 30.00000 days | Status: AC
Start: 2020-09-21 — End: ?

## 2020-10-12 ENCOUNTER — Encounter: Admit: 2020-10-12 | Discharge: 2020-10-12 | Payer: MEDICARE

## 2020-10-12 DIAGNOSIS — Z7901 Long term (current) use of anticoagulants: Secondary | ICD-10-CM

## 2020-10-12 DIAGNOSIS — Z952 Presence of prosthetic heart valve: Secondary | ICD-10-CM

## 2020-10-12 LAB — PROTIME INR (PT): INR: 2.9 mg/dL (ref 8.5–10.6)

## 2020-11-12 ENCOUNTER — Encounter: Admit: 2020-11-12 | Discharge: 2020-11-12 | Payer: MEDICARE

## 2020-11-12 DIAGNOSIS — Z7901 Long term (current) use of anticoagulants: Secondary | ICD-10-CM

## 2020-11-12 DIAGNOSIS — Z952 Presence of prosthetic heart valve: Secondary | ICD-10-CM

## 2020-11-12 LAB — PROTIME INR (PT): INR: 2.7

## 2020-11-20 ENCOUNTER — Encounter: Admit: 2020-11-20 | Discharge: 2020-11-20 | Payer: MEDICARE

## 2020-11-20 DIAGNOSIS — R9439 Abnormal result of other cardiovascular function study: Secondary | ICD-10-CM

## 2020-11-20 DIAGNOSIS — Z45010 Encounter for checking and testing of cardiac pacemaker pulse generator [battery]: Secondary | ICD-10-CM

## 2020-11-20 DIAGNOSIS — R001 Bradycardia, unspecified: Secondary | ICD-10-CM

## 2020-11-26 ENCOUNTER — Encounter: Admit: 2020-11-26 | Discharge: 2020-11-26 | Payer: MEDICARE

## 2020-11-26 MED ORDER — WARFARIN 6 MG PO TAB
ORAL_TABLET | Freq: Every day | ORAL | 3 refills | 90.00000 days | Status: AC
Start: 2020-11-26 — End: ?

## 2020-11-27 ENCOUNTER — Encounter: Admit: 2020-11-27 | Discharge: 2020-11-27 | Payer: MEDICARE

## 2020-11-27 DIAGNOSIS — I779 Disorder of arteries and arterioles, unspecified: Secondary | ICD-10-CM

## 2020-11-27 DIAGNOSIS — I350 Nonrheumatic aortic (valve) stenosis: Secondary | ICD-10-CM

## 2020-11-27 DIAGNOSIS — G459 Transient cerebral ischemic attack, unspecified: Secondary | ICD-10-CM

## 2020-11-27 DIAGNOSIS — K222 Esophageal obstruction: Secondary | ICD-10-CM

## 2020-11-27 DIAGNOSIS — E785 Hyperlipidemia, unspecified: Secondary | ICD-10-CM

## 2020-11-27 DIAGNOSIS — K2970 Gastritis, unspecified, without bleeding: Secondary | ICD-10-CM

## 2020-11-27 DIAGNOSIS — I6529 Occlusion and stenosis of unspecified carotid artery: Secondary | ICD-10-CM

## 2020-11-27 DIAGNOSIS — I2510 Atherosclerotic heart disease of native coronary artery without angina pectoris: Secondary | ICD-10-CM

## 2020-11-27 NOTE — Patient Instructions
Thank you for visiting our office today.    We would like to make the following medication adjustments:      STOP Digoxin     ** Call our nursing line at [phone] in 10 days to report symptoms**      Otherwise continue the same medications as you have been doing.          We will be pursuing the following tests after your appointment today:            Please call with any questions or concerns.        Please allow 5-7 business days for our providers to review your results. All normal results will go to MyChart. If you do not have Mychart, it is strongly recommended to get this so you can easily view all your results. If you do not have mychart, we will attempt to call you once with normal lab and testing results. If we cannot reach you by phone with normal results, we will send you a letter.  If you have not heard the results of your testing after one week please give [place] a call.       Your Cardiovascular Medicine [place] Team Brett Canales, Pilar Jarvis, Shawna Orleans, and Lynn)  phone number is [phone].

## 2020-12-13 ENCOUNTER — Encounter: Admit: 2020-12-13 | Discharge: 2020-12-13 | Payer: MEDICARE

## 2020-12-13 DIAGNOSIS — Z952 Presence of prosthetic heart valve: Secondary | ICD-10-CM

## 2020-12-13 DIAGNOSIS — Z7901 Long term (current) use of anticoagulants: Secondary | ICD-10-CM

## 2020-12-13 LAB — PROTIME INR (PT): INR: 2.6

## 2021-01-15 ENCOUNTER — Encounter: Admit: 2021-01-15 | Discharge: 2021-01-15 | Payer: MEDICARE

## 2021-01-15 DIAGNOSIS — Z7901 Long term (current) use of anticoagulants: Secondary | ICD-10-CM

## 2021-01-15 DIAGNOSIS — Z952 Presence of prosthetic heart valve: Secondary | ICD-10-CM

## 2021-01-21 ENCOUNTER — Encounter: Admit: 2021-01-21 | Discharge: 2021-01-21 | Payer: MEDICARE

## 2021-01-21 DIAGNOSIS — Z7901 Long term (current) use of anticoagulants: Secondary | ICD-10-CM

## 2021-01-21 MED ORDER — NITROGLYCERIN 0.4 MG SL SUBL
.4 mg | ORAL_TABLET | SUBLINGUAL | 1 refills | 9.00000 days | Status: AC | PRN
Start: 2021-01-21 — End: ?

## 2021-01-22 ENCOUNTER — Encounter: Admit: 2021-01-22 | Discharge: 2021-01-22 | Payer: MEDICARE

## 2021-01-22 MED ORDER — BISOPROLOL FUMARATE 5 MG PO TAB
5 mg | ORAL_TABLET | Freq: Every day | ORAL | 1 refills | 90.00000 days | Status: AC
Start: 2021-01-22 — End: ?

## 2021-01-22 NOTE — Progress Notes
Patient having dizziness x 3-4 days.  History of similar experience.  Describes as a strange wave coming over him. Previous association with HTN and relieved with nitro.  Has happened at rest and while standing and cooking breakfast. Lasted less than . Previous episode he was hospitalized.  Multiple alerts from device showing NSVT.  Discussed SDO.orders for ziebeta received.  Follow up next week.

## 2021-01-29 ENCOUNTER — Encounter: Admit: 2021-01-29 | Discharge: 2021-01-29 | Payer: MEDICARE

## 2021-01-29 DIAGNOSIS — I2510 Atherosclerotic heart disease of native coronary artery without angina pectoris: Secondary | ICD-10-CM

## 2021-01-29 DIAGNOSIS — R42 Dizziness and giddiness: Secondary | ICD-10-CM

## 2021-01-29 DIAGNOSIS — I779 Disorder of arteries and arterioles, unspecified: Secondary | ICD-10-CM

## 2021-01-29 DIAGNOSIS — I6529 Occlusion and stenosis of unspecified carotid artery: Secondary | ICD-10-CM

## 2021-01-29 DIAGNOSIS — K2970 Gastritis, unspecified, without bleeding: Secondary | ICD-10-CM

## 2021-01-29 DIAGNOSIS — K222 Esophageal obstruction: Secondary | ICD-10-CM

## 2021-01-29 DIAGNOSIS — Z950 Presence of cardiac pacemaker: Secondary | ICD-10-CM

## 2021-01-29 DIAGNOSIS — Z952 Presence of prosthetic heart valve: Secondary | ICD-10-CM

## 2021-01-29 DIAGNOSIS — I350 Nonrheumatic aortic (valve) stenosis: Secondary | ICD-10-CM

## 2021-01-29 DIAGNOSIS — G459 Transient cerebral ischemic attack, unspecified: Secondary | ICD-10-CM

## 2021-01-29 DIAGNOSIS — E785 Hyperlipidemia, unspecified: Secondary | ICD-10-CM

## 2021-01-29 NOTE — Progress Notes
Date of Service: 01/29/2021    Dominic Olsen is a 88 y.o. male.       HPI     Patient is a 88 year old Caucasian male past medical history of aortic valve disease status post aortic valve replacement on chronic anticoagulation, has dual-chamber pacemaker for carotid hypersensitivity and paroxysmal supraventricular tachycardia.  History of coronary artery disease and is status post drug-eluting stent to the left anterior descending artery.  He has preserved left ventricular ejection fraction and no other significant valvular abnormalities.  Patient's had 3 different episodes where he reports he has a change in depth perception and everything seems to be moving in front of him.  Denies true lightheadedness or concerns for syncope.  Is afraid he will fall down.  Reports he has had the spells and has been cooking before is concerned that he may burn his house down.  Reports that if he lays down for about 15 to 20 minutes seem to recover.  Does not become diuretic during the spells and denies any queasiness or upset stomach.  Had been on digoxin till November, reports since he stopped it generally feels better.  Still had an episode in December.  Has been on the bisoprolol for about 2 or 3 days reports initial tolerance has been fine and can tell he is taking medication but no real concerns from that.  Does have a cell phone reports he has a neighbor who would come and check on him with any alert.  No other chest pains, palpitations, orthopnea type symptoms.  Shortness of breath and activity tolerance is stayed about the same.         Vitals:    01/29/21 1359   BP: (!) 168/70   BP Source: Arm, Left Upper   Pulse: 62   SpO2: 94%   O2 Percent: 94 %   O2 Device: None (Room air)   PainSc: Zero   Weight: 90.5 kg (199 lb 9.6 oz)   Height: 180.3 cm (5' 11)     Body mass index is 27.84 kg/m?Marland Kitchen     Past Medical History  Patient Active Problem List    Diagnosis Date Noted   ? Preoperative cardiovascular examination 05/10/2015 ? CAD (coronary artery disease) 03/23/2015     11/29/2007   IMPRESSIONS:  1. Critical mid left anterior descending artery disease status post 3.0 x 15 Xience drug-  ? ?eluting stent deployment postdilated to 3.25 with TIMI 3 flow postprocedure, 0 percent residual stenosis, and no complication.  2. Prosthetic aortic valve noted.       ? S/P aortic valve replacement 05/25/2008   ? Stable angina (HCC) 11/26/2007   ? Abnormal cardiovascular stress test 11/26/2007   ? Chronic anticoagulation 11/26/2007   ? Encounter for pacemaker at end of battery life 11/26/2007     Medtronic     ? Bradycardia 04/29/2007     Carotid Sinus Hypersensitivity     ? Numbness of arm 02/09/2006   ? Aortic stenosis      AVR     ? Hyperlipidemia    ? Gastritis    ? Esophageal stricture          Review of Systems   Constitutional: Positive for malaise/fatigue.   HENT: Positive for tinnitus.    Eyes: Positive for blurred vision and double vision.   Cardiovascular: Negative.    Respiratory: Negative.    Endocrine: Negative.    Hematologic/Lymphatic: Negative.    Skin: Negative.    Musculoskeletal: Positive  for falls and muscle weakness.   Gastrointestinal: Negative.    Genitourinary: Negative.    Neurological: Positive for dizziness, light-headedness and weakness.   Psychiatric/Behavioral: Negative.    Allergic/Immunologic: Negative.        Physical Exam  Elderly-appearing male, well-kept and pleasant  Pupils are equal reactive without scleral injection  Neck is supple with normal Cropp stroke no bruits  Chest is symmetric with no significant deformity and palpable site of the pacemaker implant  Lungs are clear to auscultation  Heart S1 with an S2 click 1/6 systolic murmur at the right upper sternal border  Abdomen is soft and nontender  Pulse are 2+, regular, and symmetric bilateral radial as well as dorsalis pedis location  No significant peripheral edema with symmetric muscle tone and skin tone.    Cardiovascular Studies      Cardiovascular Health Factors  Vitals BP Readings from Last 3 Encounters:   01/29/21 (!) 168/70   11/27/20 (!) 156/62   04/03/20 (!) 156/66     Wt Readings from Last 3 Encounters:   01/29/21 90.5 kg (199 lb 9.6 oz)   11/27/20 90.2 kg (198 lb 12.8 oz)   04/03/20 92.5 kg (204 lb)     BMI Readings from Last 3 Encounters:   01/29/21 27.84 kg/m?   11/27/20 27.73 kg/m?   04/03/20 28.45 kg/m?      Smoking Social History     Tobacco Use   Smoking Status Former   ? Types: Cigarettes   ? Quit date: 01/14/1984   ? Years since quitting: 37.0   Smokeless Tobacco Never      Lipid Profile Cholesterol   Date Value Ref Range Status   10/31/2016 215 (H) 150 - 200 Final     HDL   Date Value Ref Range Status   10/31/2016 44  Final     LDL   Date Value Ref Range Status   10/31/2016 154 (H) <100 Final     Triglycerides   Date Value Ref Range Status   10/31/2016 132  Final      Blood Sugar No results found for: HGBA1C  Glucose   Date Value Ref Range Status   04/03/2020 114 (H) 70 - 105 Final   03/30/2020 99  Final   07/29/2018 107  Final          Problems Addressed Today  Encounter Diagnoses   Name Primary?   ? S/P aortic valve replacement Yes   ? Cardiac pacemaker in situ    ? Dysequilibrium        Assessment and Plan     Cardiovascular findings of the most part stable.  Initial tolerance of the bisoprolol has been good with ongoing monitoring.  I do suspect he may have some vestibular dysfunction or even macular degeneration that are contributing to these episodes that he is having.  Did suggest having ophthalmology evaluated him and he is really reluctant to do that at this time.  We will continue monitoring of his pacemaker per routine and continue the bisoprolol.  He can contact our office any questions or concerns.  Routine follow-up will be in about 3 or 4 months, routinely follows up with Dr. Geronimo Olsen routinely.    Current Medications (including today's revisions)  ? bisoprolol fumarate (ZEBETA) 5 mg tablet Take one tablet by mouth daily.   ? MULTIVITAMIN PO Take 1 tablet by mouth daily.   ? nitroglycerin (NITROSTAT) 0.4 mg tablet Place one tablet under tongue every 5 minutes  as needed for Chest Pain. For chest pain   ? warfarin (COUMADIN) 6 mg tablet TAKE 1 TO 2 TABLETS BY MOUTH ONCE DAILY AS DIRECTED   ? warfarin (COUMADIN) 7.5 mg tablet TAKE 1 TABLET BY MOUTH ONCE DAILY AS DIRECTED BY  CARDIOLOGY

## 2021-02-13 ENCOUNTER — Encounter: Admit: 2021-02-13 | Discharge: 2021-02-13 | Payer: MEDICARE

## 2021-02-13 DIAGNOSIS — Z7901 Long term (current) use of anticoagulants: Secondary | ICD-10-CM

## 2021-03-01 ENCOUNTER — Encounter: Admit: 2021-03-01 | Discharge: 2021-03-01 | Payer: MEDICARE

## 2021-03-01 MED ORDER — WARFARIN 6 MG PO TAB
ORAL_TABLET | ORAL | 3 refills | 90.00000 days | Status: AC
Start: 2021-03-01 — End: ?

## 2021-03-04 ENCOUNTER — Encounter: Admit: 2021-03-04 | Discharge: 2021-03-04 | Payer: MEDICARE

## 2021-03-04 DIAGNOSIS — Z7901 Long term (current) use of anticoagulants: Secondary | ICD-10-CM

## 2021-03-04 LAB — PROTIME INR (PT): INR: 2.5

## 2021-04-02 ENCOUNTER — Encounter: Admit: 2021-04-02 | Discharge: 2021-04-02 | Payer: MEDICARE

## 2021-04-02 DIAGNOSIS — Z959 Presence of cardiac and vascular implant and graft, unspecified: Secondary | ICD-10-CM

## 2021-04-09 ENCOUNTER — Encounter: Admit: 2021-04-09 | Discharge: 2021-04-09 | Payer: MEDICARE

## 2021-04-09 NOTE — Telephone Encounter
Left msg for pt at cell # re: overdue INR. Asked pt to get to lab within 1 week if he has not had INR checked since 2/20. If he has had INR checked asked him to call back and let us know when and where so we can track results. Left [facility] [place] nurse line # for call back prn.    ----- Message -----  From: Florene Routeogdill, Kelsey, RN  Sent: 04/01/2021  12:00 AM CDT  To: [facility] Nurse [place]  Subject: INR Due 04/01/21                                  Dominic Olsen, Dominic Olsen is due for an INR test on 04/01/21.

## 2021-04-10 ENCOUNTER — Encounter: Admit: 2021-04-10 | Discharge: 2021-04-10 | Payer: MEDICARE

## 2021-04-10 DIAGNOSIS — Z7901 Long term (current) use of anticoagulants: Secondary | ICD-10-CM

## 2021-04-10 LAB — PROTIME INR (PT): INR: 2.6 FL — ABNORMAL LOW (ref 80–100)

## 2021-04-15 ENCOUNTER — Encounter: Admit: 2021-04-15 | Discharge: 2021-04-15 | Payer: MEDICARE

## 2021-04-15 DIAGNOSIS — Z7901 Long term (current) use of anticoagulants: Secondary | ICD-10-CM

## 2021-04-15 DIAGNOSIS — Z952 Presence of prosthetic heart valve: Secondary | ICD-10-CM

## 2021-04-15 LAB — PROTIME INR (PT): INR: 2.6 (ref 3–12)

## 2021-05-16 ENCOUNTER — Encounter: Admit: 2021-05-16 | Discharge: 2021-05-16 | Payer: MEDICARE

## 2021-05-16 DIAGNOSIS — Z7901 Long term (current) use of anticoagulants: Secondary | ICD-10-CM

## 2021-05-16 LAB — PROTIME INR (PT)
INR: 3.3
PROTIME: 39

## 2021-05-17 ENCOUNTER — Encounter: Admit: 2021-05-17 | Discharge: 2021-05-17 | Payer: MEDICARE

## 2021-05-17 DIAGNOSIS — Z7901 Long term (current) use of anticoagulants: Secondary | ICD-10-CM

## 2021-05-17 DIAGNOSIS — Z952 Presence of prosthetic heart valve: Secondary | ICD-10-CM

## 2021-06-18 ENCOUNTER — Encounter: Admit: 2021-06-18 | Discharge: 2021-06-18 | Payer: MEDICARE

## 2021-06-18 DIAGNOSIS — Z7901 Long term (current) use of anticoagulants: Secondary | ICD-10-CM

## 2021-06-18 LAB — PROTIME INR (PT): INR: 2.7

## 2021-07-08 ENCOUNTER — Encounter: Admit: 2021-07-08 | Discharge: 2021-07-08 | Payer: MEDICARE

## 2021-07-11 ENCOUNTER — Encounter: Admit: 2021-07-11 | Discharge: 2021-07-11 | Payer: MEDICARE

## 2021-07-11 DIAGNOSIS — K2970 Gastritis, unspecified, without bleeding: Secondary | ICD-10-CM

## 2021-07-11 DIAGNOSIS — E7800 Pure hypercholesterolemia, unspecified: Secondary | ICD-10-CM

## 2021-07-11 DIAGNOSIS — Z136 Encounter for screening for cardiovascular disorders: Secondary | ICD-10-CM

## 2021-07-11 DIAGNOSIS — E785 Hyperlipidemia, unspecified: Secondary | ICD-10-CM

## 2021-07-11 DIAGNOSIS — Z952 Presence of prosthetic heart valve: Secondary | ICD-10-CM

## 2021-07-11 DIAGNOSIS — I350 Nonrheumatic aortic (valve) stenosis: Secondary | ICD-10-CM

## 2021-07-11 DIAGNOSIS — I471 Supraventricular tachycardia: Secondary | ICD-10-CM

## 2021-07-11 DIAGNOSIS — K222 Esophageal obstruction: Secondary | ICD-10-CM

## 2021-07-11 DIAGNOSIS — I2510 Atherosclerotic heart disease of native coronary artery without angina pectoris: Secondary | ICD-10-CM

## 2021-07-11 DIAGNOSIS — I6529 Occlusion and stenosis of unspecified carotid artery: Secondary | ICD-10-CM

## 2021-07-11 DIAGNOSIS — I779 Disorder of arteries and arterioles, unspecified: Secondary | ICD-10-CM

## 2021-07-11 DIAGNOSIS — I208 Other forms of angina pectoris: Secondary | ICD-10-CM

## 2021-07-11 DIAGNOSIS — G459 Transient cerebral ischemic attack, unspecified: Secondary | ICD-10-CM

## 2021-07-11 LAB — CBC
MCH: 29
MCHC: 32
MCV: 89
MPV: 9.5 — ABNORMAL LOW (ref >59)
PLATELET COUNT: 287
RDW: 15 — ABNORMAL HIGH (ref 11.6–14.4)
WBC COUNT: 7.3 — ABNORMAL HIGH (ref 70–105)

## 2021-07-11 LAB — COMPREHENSIVE METABOLIC PANEL
ANION GAP: 10
BLD UREA NITROGEN: 28 — ABNORMAL HIGH (ref 8.4–25.7)
CHLORIDE: 111 — ABNORMAL HIGH (ref 98–107)
CO2: 18 — ABNORMAL LOW (ref 23–31)
CREATININE: 1.2 — ABNORMAL HIGH (ref 0.72–1.25)
POTASSIUM: 4.3
SODIUM: 139

## 2021-07-11 LAB — BNP (B-TYPE NATRIURETIC PEPTI): BNP: 216 — ABNORMAL HIGH (ref 0–100)

## 2021-07-11 LAB — THYROID STIMULATING HORMONE-TSH: TSH: 1

## 2021-07-11 NOTE — Patient Instructions
Thank you for visiting our office today.    We would like to make the following medication adjustments:  NONE       Otherwise continue the same medications as you have been doing.          We will be pursuing the following tests after your appointment today:       Orders Placed This Encounter    CBC    COMPREHENSIVE METABOLIC PANEL    BNP (B-TYPE NATRIURETIC PEPTI)    THYROID STIMULATING HORMONE-TSH    ECG 12-LEAD    2D + DOPPLER ECHO         Please call us in the meantime with any questions or concerns.        Please allow 5-7 business days for our providers to review your results. All normal results will go to MyChart. If you do not have Mychart, it is strongly recommended to get this so you can easily view all your results. If you do not have mychart, we will attempt to call you once with normal lab and testing results. If we cannot reach you by phone with normal results, we will send you a letter.  If you have not heard the results of your testing after one week please give [place] a call.       Your Cardiovascular Medicine [place] Team Brett Canales, Pilar Jarvis, Shawna Orleans, and [place])  phone number is [phone].

## 2021-07-11 NOTE — Progress Notes
Records Request- STAT    Medical records request for continuation of care:    Patient has appointment NOW   with  Dr. Geronimo Boot    Please fax records to [facility] [place] [facility] [phone]    Request records:    Refael Fulop  [birth year]    EKG from hospitalization in Feb     Thank you,      Cardiovascular Medicine  [facility]  [address]  [place], [place] [address]  Phone:  [phone]  Fax:  [phone]

## 2021-07-23 ENCOUNTER — Encounter: Admit: 2021-07-23 | Discharge: 2021-07-23 | Payer: MEDICARE

## 2021-07-23 NOTE — Telephone Encounter
Left msg at cell # re: overdue INR. Asked pt to contact us if pt has had INR drawn recently. Advised if pt has not had INR since last check (06/18/21) pt should to get to lab within 1 week for INR. Left [facility] [place] nurse line # for call back prn.    ----- Message -----  From: Florene Route, RN  Sent: 07/15/2021  12:00 AM CDT  To: [facility] Nurse [place] [facility]  Subject: INR Due 07/15/21                                   Elige Radon is due for an INR test on 07/15/21.

## 2021-08-06 ENCOUNTER — Encounter: Admit: 2021-08-06 | Discharge: 2021-08-06 | Payer: MEDICARE

## 2021-08-06 NOTE — Telephone Encounter
Pt's son Lon called and requested an appointment for his dad to be seen.  He states that he had a fall and would like for him to be checked out.  Lon states that his dad was around a pickup truck when he fell - he wasn't sure exactly what happened.  He denies that his dad hit his head.   He isn't sure exactly when it happened, but thinks it was last week.  Recommended pt follow up with PCP or if he hit his head at all, he needed to go to the ED as he is on warfarin.  Lon states his dad will not go to any of these places.  He would like to see TLR.  First available with TLR in [place] is 8/17, he would like to be seen sooner.  Scheduled with [facility] on 8/1 in [place].  Pt' son confirmed appt time and location.  He will take his dad to ED if he develops any cognitive symptoms.  He is working on getting echo scheduled and will get an updated INR.  Will callback with any questions, concerns or problems.

## 2021-08-12 ENCOUNTER — Encounter: Admit: 2021-08-12 | Discharge: 2021-08-12 | Payer: MEDICARE

## 2021-08-13 ENCOUNTER — Encounter: Admit: 2021-08-13 | Discharge: 2021-08-13 | Payer: MEDICARE

## 2021-08-13 ENCOUNTER — Ambulatory Visit: Admit: 2021-08-13 | Discharge: 2021-08-14 | Payer: MEDICARE

## 2021-08-13 DIAGNOSIS — Z952 Presence of prosthetic heart valve: Secondary | ICD-10-CM

## 2021-08-13 DIAGNOSIS — R627 Adult failure to thrive: Secondary | ICD-10-CM

## 2021-08-13 DIAGNOSIS — K2970 Gastritis, unspecified, without bleeding: Secondary | ICD-10-CM

## 2021-08-13 DIAGNOSIS — Z7901 Long term (current) use of anticoagulants: Secondary | ICD-10-CM

## 2021-08-13 DIAGNOSIS — R351 Nocturia: Secondary | ICD-10-CM

## 2021-08-13 DIAGNOSIS — R42 Dizziness and giddiness: Secondary | ICD-10-CM

## 2021-08-13 DIAGNOSIS — I779 Disorder of arteries and arterioles, unspecified: Secondary | ICD-10-CM

## 2021-08-13 DIAGNOSIS — E785 Hyperlipidemia, unspecified: Secondary | ICD-10-CM

## 2021-08-13 DIAGNOSIS — Z959 Presence of cardiac and vascular implant and graft, unspecified: Secondary | ICD-10-CM

## 2021-08-13 DIAGNOSIS — I2510 Atherosclerotic heart disease of native coronary artery without angina pectoris: Secondary | ICD-10-CM

## 2021-08-13 DIAGNOSIS — I6529 Occlusion and stenosis of unspecified carotid artery: Secondary | ICD-10-CM

## 2021-08-13 DIAGNOSIS — K222 Esophageal obstruction: Secondary | ICD-10-CM

## 2021-08-13 DIAGNOSIS — I350 Nonrheumatic aortic (valve) stenosis: Secondary | ICD-10-CM

## 2021-08-13 DIAGNOSIS — G459 Transient cerebral ischemic attack, unspecified: Secondary | ICD-10-CM

## 2021-08-13 LAB — PROTIME INR (PT): INR: 2.4

## 2021-08-13 NOTE — Patient Instructions
Follow up in 6 months  Echo tomorrow 8/2 at 1pm  Device check as scheduled in Sept  Follow up in 6 months  Follow up as directed.  Call sooner if issues.  Call the [place] nursing line at [phone].  Leave a detailed message for the nurse in [place] with how we can assist you and we will call you back.

## 2021-08-13 NOTE — Progress Notes
Date of Service: 08/13/2021    Dominic Olsen is a 88 y.o. male.       HPI     Patient is now a 88 year old Caucasian male past medical history of stenotic aortic valve status post prosthetic valve placement.  He has permanent atrial fibrillation and sick sinus syndrome and does have a dual-chamber pacemaker in place.  Patient has continued struggle with severe fatigue and malaise.  Reports dizziness and falls but no syncope.  He has been out of the hospital for various assessments because of his symptoms and events.  Struggles significantly with frequent nocturia.  States that when he is in the hospital using a urinal when lying down or sitting up is very difficult, getting to and from the toilet and rising from the toilet can be very difficult.  Is not having chest pain, orthopnea, or palpitations.  Device checks have not shown change in conduction or pacing.  Recent blood work showed stable renal function, thyroid function, no significant change in his BNP, no anemia or findings for other infection.  Has had low blood pressure at times at this point his pain was weaned away from all the medications except for his warfarin and tamsulosin.         Vitals:    08/13/21 1201   BP: (!) 148/70   BP Source: Arm, Left Upper   Pulse: 75   SpO2: 97%   O2 Device: None (Room air)   PainSc: Zero   Weight: 86.3 kg (190 lb 3.2 oz)   Height: 180.3 cm (5' 11)     Body mass index is 26.53 kg/m?Marland Kitchen     Past Medical History  Patient Active Problem List    Diagnosis Date Noted   ? Supraventricular tachycardia (HCC) 07/11/2021   ? Preoperative cardiovascular examination 05/10/2015   ? CAD (coronary artery disease) 03/23/2015     11/29/2007   IMPRESSIONS:  1. Critical mid left anterior descending artery disease status post 3.0 x 15 Xience drug-  ? ?eluting stent deployment postdilated to 3.25 with TIMI 3 flow postprocedure, 0 percent residual stenosis, and no complication.  2. Prosthetic aortic valve noted.       ? S/P aortic valve replacement 05/25/2008   ? Stable angina (HCC) 11/26/2007   ? Abnormal cardiovascular stress test 11/26/2007   ? Chronic anticoagulation 11/26/2007   ? Encounter for pacemaker at end of battery life 11/26/2007     Medtronic     ? Bradycardia 04/29/2007     Carotid Sinus Hypersensitivity     ? Numbness of arm 02/09/2006   ? Aortic stenosis      AVR     ? Hyperlipidemia    ? Gastritis    ? Esophageal stricture          Review of Systems   Constitutional: Negative.   HENT: Negative.    Eyes: Negative.    Cardiovascular: Negative.    Respiratory: Negative.    Endocrine: Negative.    Hematologic/Lymphatic: Negative.    Skin: Negative.    Musculoskeletal: Negative.    Gastrointestinal: Negative.    Genitourinary: Negative.    Neurological: Negative.    Psychiatric/Behavioral: Negative.    Allergic/Immunologic: Negative.        Physical Exam  Awake and alert, elderly looking gentleman who is sitting in wheelchair.  Somewhat disgruntled but not unpleasant  Pupils Rigg react without scleral injection  Neck is supple no carotid upstroke and no obvious bruits or jugular venous abnormalities  Lungs are clear to auscultate being is able to get good effort  Heart S1, S2 that are essentially normal.  Do not hear click or gallop no other findings of significant murmur  Abdomen is flat and nontender  Pulses are 2+, regular, and symmetric bilaterally radial as well as pedal locations  Does not have significant peripheral edema or obvious skin abnormalities.  Does have bandaging in place from recent blood draw    Cardiovascular Studies    ECG from the end of June shows atrial fibrillation with nonspecific repolarization abnormality in the inferior leads.  Echocardiogram is scheduled for tomorrow  Cardiovascular Health Factors  Vitals BP Readings from Last 3 Encounters:   08/13/21 (!) 148/70   07/11/21 108/62   01/29/21 (!) 168/70     Wt Readings from Last 3 Encounters:   08/13/21 86.3 kg (190 lb 3.2 oz)   07/11/21 85.7 kg (189 lb) 01/29/21 90.5 kg (199 lb 9.6 oz)     BMI Readings from Last 3 Encounters:   08/13/21 26.53 kg/m?   07/11/21 26.36 kg/m?   01/29/21 27.84 kg/m?      Smoking Social History     Tobacco Use   Smoking Status Former   ? Types: Cigarettes   ? Quit date: 01/14/1984   ? Years since quitting: 37.6   Smokeless Tobacco Never      Lipid Profile Cholesterol   Date Value Ref Range Status   10/31/2016 215 (H) 150 - 200 Final     HDL   Date Value Ref Range Status   10/31/2016 44  Final     LDL   Date Value Ref Range Status   10/31/2016 154 (H) <100 Final     Triglycerides   Date Value Ref Range Status   10/31/2016 132  Final      Blood Sugar No results found for: HGBA1C  Glucose   Date Value Ref Range Status   07/11/2021 117 (H) 70 - 105 Final   02/20/2021 159 (H) 70 - 105 Final   04/03/2020 114 (H) 70 - 105 Final          Problems Addressed Today  Encounter Diagnoses   Name Primary?   ? S/P aortic valve replacement Yes   ? Cardiac device in situ    ? Chronic anticoagulation    ? Dysequilibrium    ? Failure to thrive in adult    ? Nocturia        Assessment and Plan     His heart and vascular findings are essentially stable.  His ongoing fatigue and malaise are likely multifactorial.  Have moved away from multiple medications especially for his blood pressure and he is tolerating that well without signs of lability or significant increase in his blood pressure.  INR has been stable with no complications from his anticoagulation.  Limited therapy available to help can better tolerate his nocturia and sleep disorder.  We will await for the results of the echocardiogram and we can contact him about the results with routine monitoring of his pacemaker.  Follow-up can be in approximate 6 months.         Current Medications (including today's revisions)  ? MULTIVITAMIN PO Take 1 tablet by mouth daily.   ? nitroglycerin (NITROSTAT) 0.4 mg tablet Place one tablet under tongue every 5 minutes as needed for Chest Pain. For chest pain   ? tamsulosin (FLOMAX) 0.4 mg capsule   TAKE 1 CAPSULE BY MOUTH ONCE DAILY AT BEDTIME   ?  warfarin (COUMADIN) 6 mg tablet TAKE 1 TO 2 TABLETS BY MOUTH ONCE DAILY AS DIRECTED   ? warfarin (COUMADIN) 7.5 mg tablet TAKE 1 TABLET BY MOUTH ONCE DAILY AS DIRECTED BY  CARDIOLOGY

## 2021-08-13 NOTE — Progress Notes
08/13/2021 1:40 PM   Pt reports he had more vit K in his diet recently. Pt does not want to change dose or even take a little more warfarin. Pt will stay the same at 6 mg daily  and recheck in 2 weeks.

## 2021-08-14 ENCOUNTER — Encounter: Admit: 2021-08-14 | Discharge: 2021-08-14 | Payer: MEDICARE

## 2021-08-14 ENCOUNTER — Ambulatory Visit: Admit: 2021-08-14 | Discharge: 2021-08-14 | Payer: MEDICARE

## 2021-08-14 DIAGNOSIS — I350 Nonrheumatic aortic (valve) stenosis: Secondary | ICD-10-CM

## 2021-08-16 ENCOUNTER — Encounter: Admit: 2021-08-16 | Discharge: 2021-08-16 | Payer: MEDICARE

## 2021-08-16 DIAGNOSIS — I350 Nonrheumatic aortic (valve) stenosis: Secondary | ICD-10-CM

## 2021-08-16 DIAGNOSIS — I208 Other forms of angina pectoris: Secondary | ICD-10-CM

## 2021-08-16 DIAGNOSIS — I2510 Atherosclerotic heart disease of native coronary artery without angina pectoris: Secondary | ICD-10-CM

## 2021-08-16 NOTE — Telephone Encounter
-----   Message from Renetta Chalk, RN sent at 08/16/2021  8:28 AM CDT -----    ----- Message -----  From: Laurence Aly, MD  Sent: 08/14/2021   6:46 PM CDT  To: Renetta Chalk, RN    Please be sure wee have f/u arranged with him in our [place] office.

## 2021-08-16 NOTE — Telephone Encounter
Results and recommendations called to patient.

## 2021-08-27 ENCOUNTER — Encounter: Admit: 2021-08-27 | Discharge: 2021-08-27 | Payer: MEDICARE

## 2021-08-27 DIAGNOSIS — Z952 Presence of prosthetic heart valve: Secondary | ICD-10-CM

## 2021-08-27 DIAGNOSIS — Z7901 Long term (current) use of anticoagulants: Secondary | ICD-10-CM

## 2021-08-27 LAB — PROTIME INR (PT): INR: 2.9 — ABNORMAL HIGH (ref 0.89–1.11)

## 2021-09-13 ENCOUNTER — Encounter: Admit: 2021-09-13 | Discharge: 2021-09-13 | Payer: MEDICARE

## 2021-09-13 DIAGNOSIS — Z7901 Long term (current) use of anticoagulants: Secondary | ICD-10-CM

## 2021-09-13 DIAGNOSIS — Z952 Presence of prosthetic heart valve: Secondary | ICD-10-CM

## 2021-09-26 ENCOUNTER — Encounter: Admit: 2021-09-26 | Discharge: 2021-09-26 | Payer: MEDICARE

## 2021-09-26 ENCOUNTER — Ambulatory Visit: Admit: 2021-09-26 | Discharge: 2021-09-26 | Payer: MEDICARE

## 2021-09-26 DIAGNOSIS — Z950 Presence of cardiac pacemaker: Secondary | ICD-10-CM

## 2021-09-26 DIAGNOSIS — Z959 Presence of cardiac and vascular implant and graft, unspecified: Secondary | ICD-10-CM

## 2021-10-29 ENCOUNTER — Encounter: Admit: 2021-10-29 | Discharge: 2021-10-29 | Payer: MEDICARE

## 2021-10-29 NOTE — Telephone Encounter
Called and spoke with patient about overdue INR.  He states he will go have it checked as soon as he can.  Will callback with any questions, concerns or problems.

## 2021-11-13 ENCOUNTER — Encounter: Admit: 2021-11-13 | Discharge: 2021-11-13 | Payer: MEDICARE

## 2021-11-13 DIAGNOSIS — Z952 Presence of prosthetic heart valve: Secondary | ICD-10-CM

## 2021-11-13 DIAGNOSIS — Z7901 Long term (current) use of anticoagulants: Secondary | ICD-10-CM

## 2021-11-28 ENCOUNTER — Encounter: Admit: 2021-11-28 | Discharge: 2021-11-28 | Payer: MEDICARE

## 2021-11-28 DIAGNOSIS — I6529 Occlusion and stenosis of unspecified carotid artery: Secondary | ICD-10-CM

## 2021-11-28 DIAGNOSIS — I2089 Stable angina: Secondary | ICD-10-CM

## 2021-11-28 DIAGNOSIS — I350 Nonrheumatic aortic (valve) stenosis: Secondary | ICD-10-CM

## 2021-11-28 DIAGNOSIS — K2970 Gastritis, unspecified, without bleeding: Secondary | ICD-10-CM

## 2021-11-28 DIAGNOSIS — I2510 Atherosclerotic heart disease of native coronary artery without angina pectoris: Secondary | ICD-10-CM

## 2021-11-28 DIAGNOSIS — I779 Disorder of arteries and arterioles, unspecified: Secondary | ICD-10-CM

## 2021-11-28 DIAGNOSIS — G459 Transient cerebral ischemic attack, unspecified: Secondary | ICD-10-CM

## 2021-11-28 DIAGNOSIS — K222 Esophageal obstruction: Secondary | ICD-10-CM

## 2021-11-28 DIAGNOSIS — E785 Hyperlipidemia, unspecified: Secondary | ICD-10-CM

## 2021-11-29 ENCOUNTER — Encounter: Admit: 2021-11-29 | Discharge: 2021-11-29 | Payer: MEDICARE

## 2021-11-29 ENCOUNTER — Ambulatory Visit: Admit: 2021-11-29 | Discharge: 2021-11-29 | Payer: MEDICARE

## 2021-11-29 DIAGNOSIS — R7881 Bacteremia: Secondary | ICD-10-CM

## 2021-11-30 ENCOUNTER — Encounter: Admit: 2021-11-30 | Discharge: 2021-11-30 | Payer: MEDICARE

## 2021-12-01 ENCOUNTER — Encounter: Admit: 2021-12-01 | Discharge: 2021-12-01 | Payer: MEDICARE

## 2021-12-03 ENCOUNTER — Encounter: Admit: 2021-12-03 | Discharge: 2021-12-03 | Payer: MEDICARE

## 2021-12-11 ENCOUNTER — Encounter: Admit: 2021-12-11 | Discharge: 2021-12-11 | Payer: MEDICARE

## 2021-12-17 ENCOUNTER — Encounter: Admit: 2021-12-17 | Discharge: 2021-12-17 | Payer: MEDICARE

## 2021-12-17 DIAGNOSIS — Z952 Presence of prosthetic heart valve: Secondary | ICD-10-CM

## 2021-12-17 DIAGNOSIS — Z7901 Long term (current) use of anticoagulants: Secondary | ICD-10-CM

## 2021-12-17 LAB — PROTIME INR (PT): INR: 1.5 — ABNORMAL LOW

## 2021-12-23 ENCOUNTER — Encounter: Admit: 2021-12-23 | Discharge: 2021-12-23 | Payer: MEDICARE

## 2021-12-23 DIAGNOSIS — Z7901 Long term (current) use of anticoagulants: Secondary | ICD-10-CM

## 2021-12-23 DIAGNOSIS — Z952 Presence of prosthetic heart valve: Secondary | ICD-10-CM

## 2021-12-23 LAB — PROTIME INR (PT): INR: 2.3 — ABNORMAL HIGH (ref 0.90–1.10)

## 2022-01-02 ENCOUNTER — Encounter: Admit: 2022-01-02 | Discharge: 2022-01-02 | Payer: MEDICARE

## 2022-01-02 DIAGNOSIS — Z7901 Long term (current) use of anticoagulants: Secondary | ICD-10-CM

## 2022-01-02 DIAGNOSIS — Z952 Presence of prosthetic heart valve: Secondary | ICD-10-CM

## 2022-01-02 LAB — PROTIME INR (PT): INR: 2.5 — ABNORMAL HIGH (ref 0.89–1.11)

## 2022-01-02 NOTE — Progress Notes
INR is therapeutic. Continue with current dose of 7.5 mg on Tuesday and Thursday and 6 mg all other days.   Recheck INR in one week.     LVM for pt with detailed instructions and when to recheck. Call back number provided.

## 2022-01-08 ENCOUNTER — Encounter: Admit: 2022-01-08 | Discharge: 2022-01-08 | Payer: MEDICARE

## 2022-01-08 DIAGNOSIS — Z7901 Long term (current) use of anticoagulants: Secondary | ICD-10-CM

## 2022-01-08 DIAGNOSIS — Z952 Presence of prosthetic heart valve: Secondary | ICD-10-CM

## 2022-01-08 DIAGNOSIS — I4891 Unspecified atrial fibrillation: Secondary | ICD-10-CM

## 2022-01-08 LAB — PROTIME INR (PT): INR: 2.7 mg/dL — ABNORMAL HIGH (ref 70–100)

## 2022-02-13 ENCOUNTER — Encounter: Admit: 2022-02-13 | Discharge: 2022-02-13 | Payer: MEDICARE

## 2022-02-13 DIAGNOSIS — Z952 Presence of prosthetic heart valve: Secondary | ICD-10-CM

## 2022-02-13 DIAGNOSIS — Z7901 Long term (current) use of anticoagulants: Secondary | ICD-10-CM

## 2022-02-13 DIAGNOSIS — I4891 Unspecified atrial fibrillation: Secondary | ICD-10-CM

## 2022-03-14 ENCOUNTER — Encounter: Admit: 2022-03-14 | Discharge: 2022-03-14 | Payer: MEDICARE

## 2022-03-14 DIAGNOSIS — Z952 Presence of prosthetic heart valve: Secondary | ICD-10-CM

## 2022-03-14 DIAGNOSIS — I4891 Unspecified atrial fibrillation: Secondary | ICD-10-CM

## 2022-03-14 DIAGNOSIS — Z7901 Long term (current) use of anticoagulants: Secondary | ICD-10-CM

## 2022-03-25 ENCOUNTER — Encounter: Admit: 2022-03-25 | Discharge: 2022-03-25 | Payer: MEDICARE

## 2022-03-25 ENCOUNTER — Ambulatory Visit: Admit: 2022-03-25 | Discharge: 2022-03-25 | Payer: MEDICARE

## 2022-03-25 DIAGNOSIS — Z950 Presence of cardiac pacemaker: Secondary | ICD-10-CM

## 2022-03-25 NOTE — Telephone Encounter
Pt LVM on Device Team line requesting CB number for [facility]. Pt was contacted and discussed 13:00pm device check scheduled for 03/25/22. Pt stated he was contacted by [facility] scheduling representative and was informed device check was 03/27/22. Pt sated he was talking w/ SDO and was informed IOC was actually scheduled for 03/25/22 @ 13:00pm. Pt was informed if he was able to make it to the [place] clinic my 15:00,  a device check could be completed. Pt reported his phone then stopped working. Pt stated he would be attempting to make it to the clinic by 15:00. Msg sent to [facility] Team. No further action needed.

## 2022-03-27 ENCOUNTER — Encounter: Admit: 2022-03-27 | Discharge: 2022-03-27 | Payer: MEDICARE

## 2022-03-27 MED ORDER — WARFARIN 6 MG PO TAB
ORAL_TABLET | ORAL | 0 refills | 90.00000 days | Status: AC
Start: 2022-03-27 — End: ?

## 2022-04-03 ENCOUNTER — Encounter: Admit: 2022-04-03 | Discharge: 2022-04-03 | Payer: MEDICARE

## 2022-04-14 ENCOUNTER — Encounter: Admit: 2022-04-14 | Discharge: 2022-04-14 | Payer: MEDICARE

## 2022-04-14 DIAGNOSIS — Z7901 Long term (current) use of anticoagulants: Secondary | ICD-10-CM

## 2022-04-14 DIAGNOSIS — I4891 Unspecified atrial fibrillation: Secondary | ICD-10-CM

## 2022-04-14 DIAGNOSIS — Z952 Presence of prosthetic heart valve: Secondary | ICD-10-CM

## 2022-04-14 LAB — PROTIME INR (PT): INR: 2.6 — ABNORMAL HIGH (ref 0.89–1.11)

## 2022-05-14 ENCOUNTER — Encounter: Admit: 2022-05-14 | Discharge: 2022-05-14 | Payer: MEDICARE

## 2022-05-14 DIAGNOSIS — Z7901 Long term (current) use of anticoagulants: Secondary | ICD-10-CM

## 2022-05-14 DIAGNOSIS — Z952 Presence of prosthetic heart valve: Secondary | ICD-10-CM

## 2022-05-14 DIAGNOSIS — I4891 Unspecified atrial fibrillation: Secondary | ICD-10-CM

## 2022-06-04 ENCOUNTER — Encounter: Admit: 2022-06-04 | Discharge: 2022-06-04 | Payer: MEDICARE

## 2022-06-04 DIAGNOSIS — Z952 Presence of prosthetic heart valve: Secondary | ICD-10-CM

## 2022-06-04 DIAGNOSIS — I4891 Unspecified atrial fibrillation: Secondary | ICD-10-CM

## 2022-06-04 DIAGNOSIS — Z7901 Long term (current) use of anticoagulants: Secondary | ICD-10-CM

## 2022-06-04 LAB — PROTIME INR (PT): INR: 2.2

## 2022-06-19 ENCOUNTER — Encounter: Admit: 2022-06-19 | Discharge: 2022-06-19 | Payer: MEDICARE

## 2022-06-19 DIAGNOSIS — Z7901 Long term (current) use of anticoagulants: Secondary | ICD-10-CM

## 2022-06-19 DIAGNOSIS — Z952 Presence of prosthetic heart valve: Secondary | ICD-10-CM

## 2022-06-19 DIAGNOSIS — I4891 Unspecified atrial fibrillation: Secondary | ICD-10-CM

## 2022-06-19 LAB — PROTIME INR (PT): INR: 2.4 — ABNORMAL LOW

## 2022-07-05 IMAGING — CT ABDOMEN_PELVIS W(Adult)
2 of 3 series · 12 of 46 positions shown, 14 images · non-contrast
Comparison: No relevant prior studies available.

02/18/21

DIAGNOSTIC STUDIES
EXAM:  CT ABDOMEN AND PELVIS WITH INTRAVENOUS CONTRAST  ([id number])
INDICATION: abdominal pain c/o abd pain, back pain , N/V/D. hx of gallbladder surgery , creat
TECHNIQUE: Axial computed tomography images of the abdomen and pelvis with intravenous contrast.

[Series 2: abdomen_pelvis ax 3.00 br40 s3 · axial · 0.62mm/px · z∈[+1071,+1272]mm · 9 of 79 slices shown, 11 images]
[im 6/79  soft-tissue]
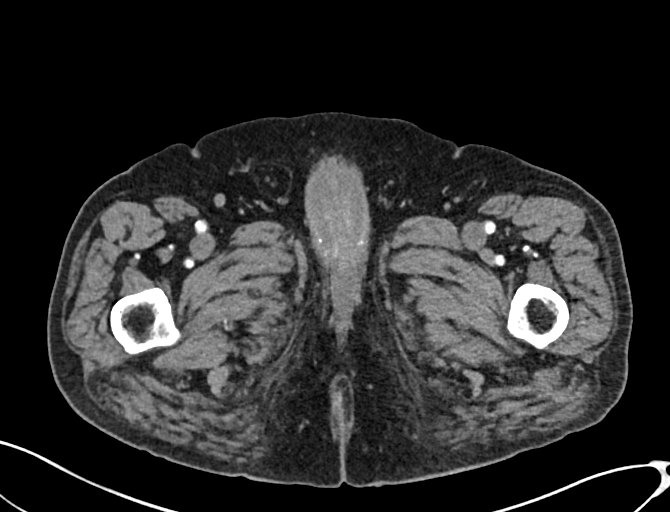
[im 6/79  bone]
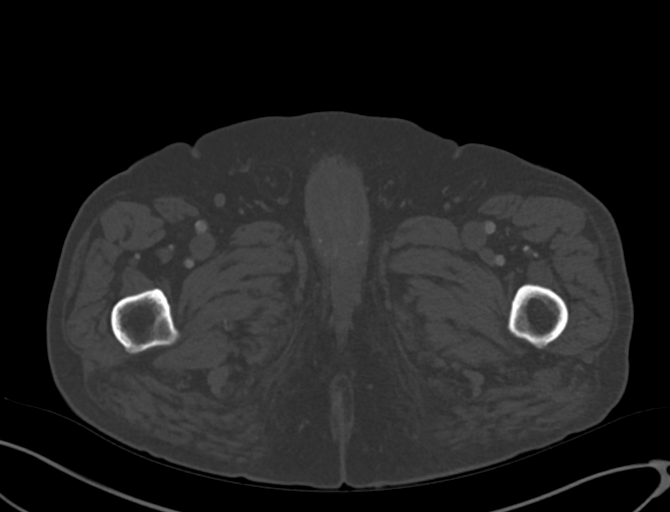
[im 16/79  soft-tissue]
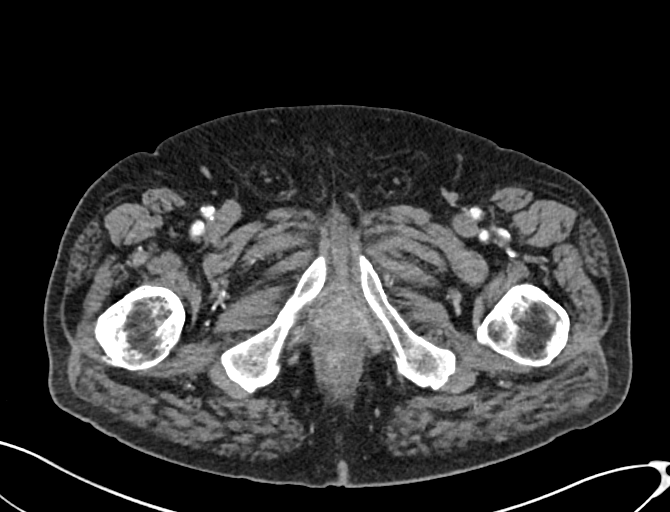
[im 23/79  soft-tissue]
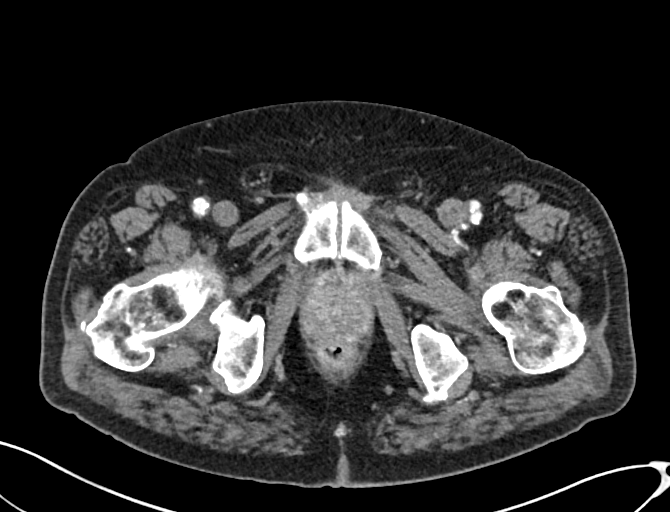
[im 31/79  soft-tissue]
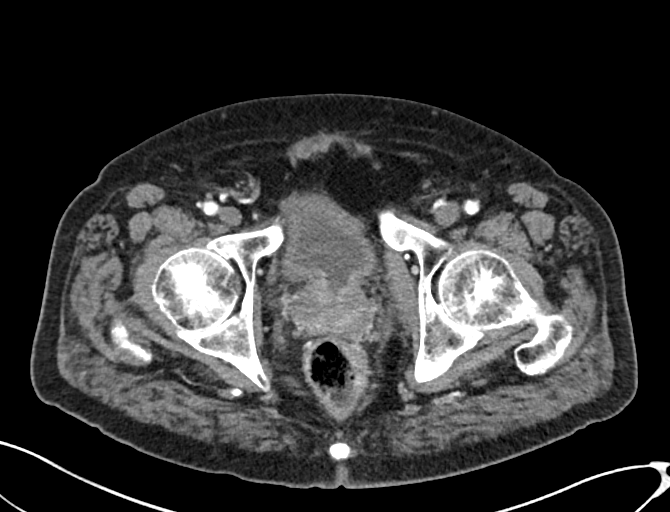
[im 41/79  soft-tissue]
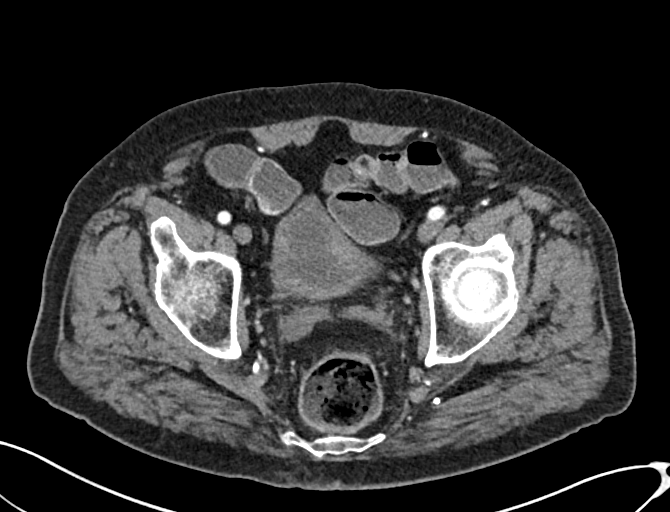
[im 48/79  soft-tissue]
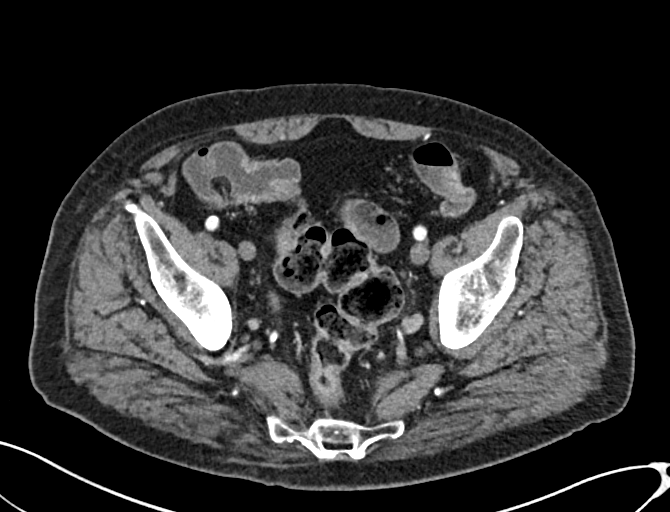
[im 56/79  soft-tissue]
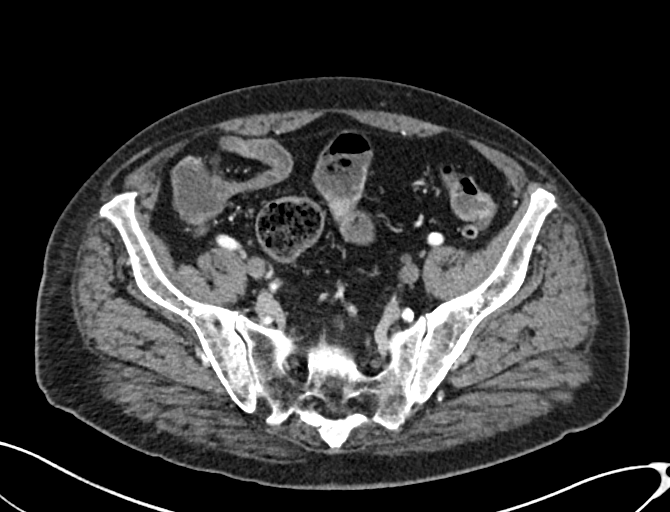
[im 66/79  soft-tissue]
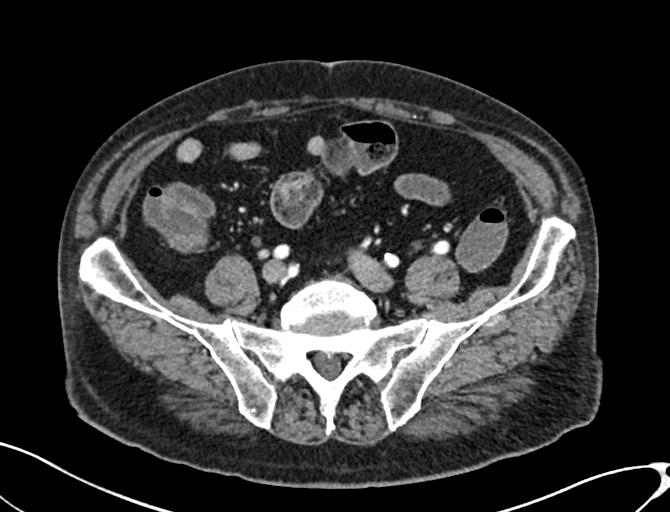
[im 73/79  soft-tissue]
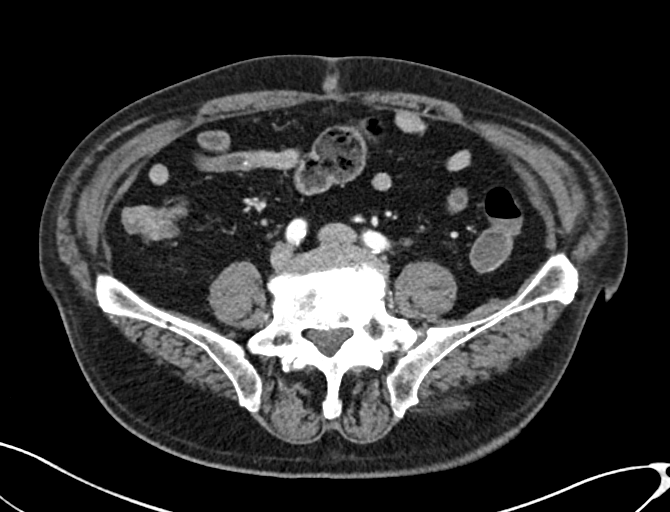
[im 73/79  bone]
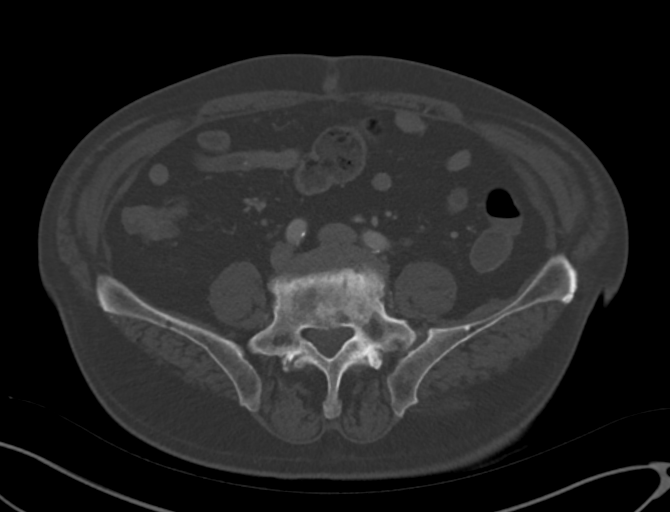

[Series 4: abdomen_pelvis cor 3.00 br40 s3 · coronal · 0.81mm/px · 3 of 104 slices shown]
[im 35/104  soft-tissue]
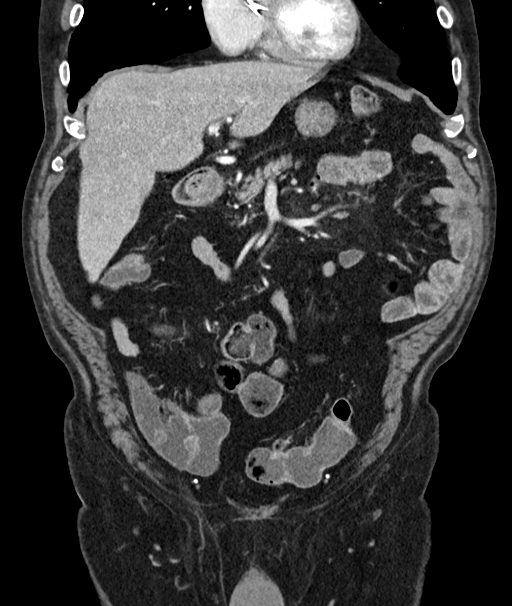
[im 46/104  soft-tissue]
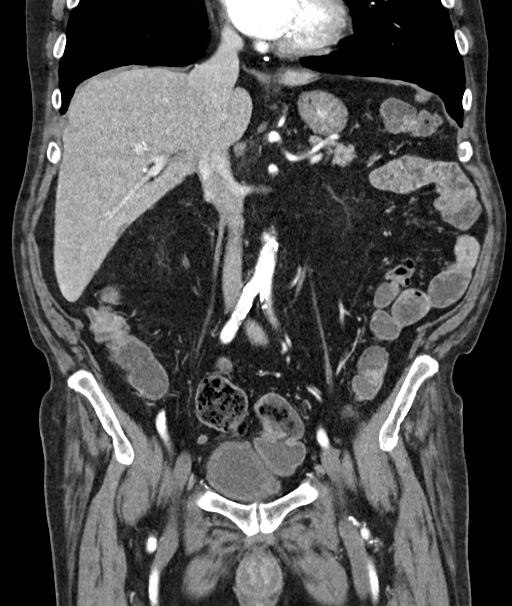
[im 58/104  soft-tissue]
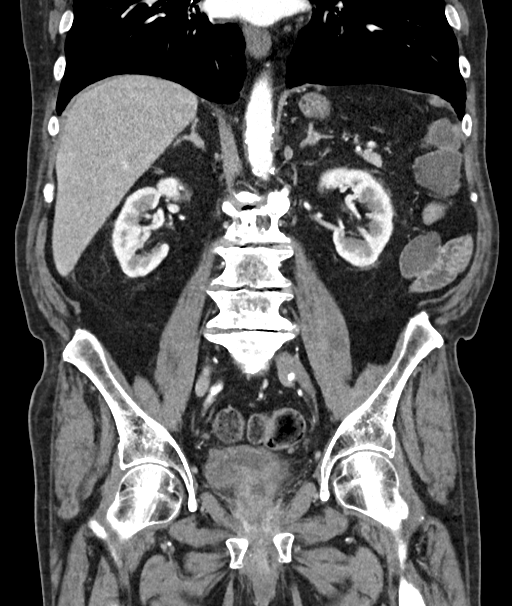

[12 of 46 positions shown; findings below may reference images not displayed]

All CT scans at this facility use dose modulation, interval reconstruction, and/or weight-based
dosing when appropriate to reduce radiation dose to as low as reasonably achievable.

Sagittal and coronal reformatted images were created and reviewed.

All CT scans at this facility use dose modulation, interval reconstruction, and/or weight-based
dosing when appropriate to reduce radiation dose to as low as reasonably achievable.

Number of previous computed tomography exams in the last 12 months is 0. Number of previous nuclear
medicine myocardial perfusion studies in the last 12 months is 0.
FINDINGS: LUNG BASES:  Mild dependent changes/atelectasis.

Minimal chronic-appearing coarse interstitial accentuation.

ABDOMEN:

LIVER:  Minimal changes in the hepatic parenchyma consistent with fatty infiltration.

GALLBLADDER AND BILE DUCTS:  Postsurgical changes from prior cholecystectomy.

PANCREAS:  NO edema or inflammatory changes. NO significant ductal dilatation. NO definite mass.

SPLEEN:  NO mass or abnormal enlargement.

ADRENALS:  There is thickening of the minimal bilateral adrenal gland. There are no definite
masses.

KIDNEYS AND URETERS:  NO dilatation of the renal collecting systems. NO intrarenal stones are
present.

URETERS: NO definite ureteral stones.

STOMACH AND BOWEL:  The stomach demonstrates pseudothickening from incomplete distention.

Nondistended, partially fluid and this is prominent in the LEFT upper quadrant and LEFT midabdomen.
Minimal mucosal/wall thickening. This is most consistent with enteritis. This is of uncertain
etiology filled small bowel loops.

The colon demonstrates multifocal nonspecific air-fluid levels. NO significant distention.

Mild distal diverticulosis. NO specific CT evidence for diverticulitis.

PELVIS:

APPENDIX:  The appendix is not definitely visualized.

BLADDER:  Nondistended bladder. Limited/incomplete evaluation.

REPRODUCTIVE:  Mild to moderate enlargement of the prostate.

ABDOMEN and PELVIS:

BONES/JOINTS:  NO definite acute compression fractures.

Marked degenerative changes in the spine.

SOFT TISSUES:  Soft tissues demonstrate NO fluid collections or foreign body.

VASCULATURE:  The aorta demonstrates NO aneurysmal distention or rupture.  There is moderate to
marked arterial vascular calcification.

LYMPH NODES:  NO pelvic mass or significant lymph node enlargement.

Minimally increased attenuation in the central mesentery with minimally enlarged nonspecific lymph
nodes.

TUBES, LINES AND DEVICES:  Cardiac leads are incompletely evaluated on this study.
IMPRESSION: 1.  Nondistended, partially fluid and this is prominent in the LEFT upper quadrant and LEFT
midabdomen. Minimal mucosal/wall thickening. This is most consistent with enteritis. This is of
uncertain etiology filled small bowel loops.

2.  Colonic findings as described. The differential includes ileus and/or colitis. NO definite
evidence of obstruction.

3.  Minimally increased attenuation in the central mesentery with minimally enlarged nonspecific
lymph nodes.  The differential includes nonspecific mesenteritis, inflammatory changes within the
bowel and/or vessels.

4.  Minimal changes in the hepatic parenchyma consistent with fatty infiltration.

5.  There is thickening of the minimal bilateral adrenal gland. There are no definite masses.
Differential would include changes secondary to stress and adrenal hyperplasia. Please correlate
with any known laboratory/biochemical findings and/or clinical symptoms.

Additional chronic/incidental findings as described.

Tech Notes:

c/o abd pain, back pain , N/V/D. hx of gallbladder surgery , creat 1.42 GFR 49.8.

## 2022-07-28 ENCOUNTER — Encounter: Admit: 2022-07-28 | Discharge: 2022-07-28 | Payer: MEDICARE

## 2022-07-28 DIAGNOSIS — I4891 Unspecified atrial fibrillation: Secondary | ICD-10-CM

## 2022-07-28 DIAGNOSIS — Z7901 Long term (current) use of anticoagulants: Secondary | ICD-10-CM

## 2022-07-28 DIAGNOSIS — Z952 Presence of prosthetic heart valve: Secondary | ICD-10-CM

## 2022-07-28 LAB — PROTIME INR (PT): INR: 3.2 % (ref 41–77)

## 2022-08-05 ENCOUNTER — Encounter: Admit: 2022-08-05 | Discharge: 2022-08-05 | Payer: MEDICARE

## 2022-08-05 MED ORDER — WARFARIN 6 MG PO TAB
ORAL_TABLET | ORAL | 1 refills | 90.00000 days | Status: AC
Start: 2022-08-05 — End: ?

## 2022-09-17 ENCOUNTER — Encounter: Admit: 2022-09-17 | Discharge: 2022-09-17 | Payer: MEDICARE

## 2022-09-17 NOTE — Telephone Encounter
INR Overdue. Called and left message requesting pt to have INR drawn at earliest convenience.  Left callback number for any questions or concerns.

## 2022-09-18 ENCOUNTER — Encounter: Admit: 2022-09-18 | Discharge: 2022-09-18 | Payer: MEDICARE

## 2022-09-18 DIAGNOSIS — Z7901 Long term (current) use of anticoagulants: Secondary | ICD-10-CM

## 2022-09-18 DIAGNOSIS — I4891 Unspecified atrial fibrillation: Secondary | ICD-10-CM

## 2022-09-18 DIAGNOSIS — Z952 Presence of prosthetic heart valve: Secondary | ICD-10-CM

## 2022-09-18 LAB — PROTIME INR (PT): INR: 3.8

## 2022-10-15 ENCOUNTER — Encounter: Admit: 2022-10-15 | Discharge: 2022-10-15 | Payer: MEDICARE

## 2022-10-15 DIAGNOSIS — Z7901 Long term (current) use of anticoagulants: Secondary | ICD-10-CM

## 2022-10-15 DIAGNOSIS — Z952 Presence of prosthetic heart valve: Secondary | ICD-10-CM

## 2022-10-15 DIAGNOSIS — I4891 Unspecified atrial fibrillation: Secondary | ICD-10-CM

## 2022-10-15 LAB — PROTIME INR (PT): INR: 5.5 — ABNORMAL HIGH

## 2022-10-20 ENCOUNTER — Encounter: Admit: 2022-10-20 | Discharge: 2022-10-20 | Payer: MEDICARE

## 2022-10-20 DIAGNOSIS — I4891 Unspecified atrial fibrillation: Secondary | ICD-10-CM

## 2022-10-20 DIAGNOSIS — Z7901 Long term (current) use of anticoagulants: Secondary | ICD-10-CM

## 2022-10-20 DIAGNOSIS — Z952 Presence of prosthetic heart valve: Secondary | ICD-10-CM

## 2022-10-20 LAB — PROTIME INR (PT): INR: 1.7 mg/dL — ABNORMAL LOW (ref 8.5–10.6)

## 2022-10-20 MED ORDER — WARFARIN 2 MG PO TAB
1-2 mg | ORAL_TABLET | ORAL | 1 refills | 90.00000 days | Status: AC
Start: 2022-10-20 — End: ?

## 2022-10-28 ENCOUNTER — Encounter: Admit: 2022-10-28 | Discharge: 2022-10-28 | Payer: MEDICARE

## 2022-10-28 DIAGNOSIS — I4891 Unspecified atrial fibrillation: Secondary | ICD-10-CM

## 2022-10-28 DIAGNOSIS — Z7901 Long term (current) use of anticoagulants: Secondary | ICD-10-CM

## 2022-10-28 DIAGNOSIS — Z952 Presence of prosthetic heart valve: Secondary | ICD-10-CM

## 2022-10-28 LAB — PROTIME INR (PT): INR: 2.1

## 2022-11-03 ENCOUNTER — Encounter: Admit: 2022-11-03 | Discharge: 2022-11-03 | Payer: MEDICARE

## 2022-11-03 DIAGNOSIS — Z7901 Long term (current) use of anticoagulants: Secondary | ICD-10-CM

## 2022-11-03 DIAGNOSIS — Z952 Presence of prosthetic heart valve: Secondary | ICD-10-CM

## 2022-11-03 DIAGNOSIS — I4891 Unspecified atrial fibrillation: Secondary | ICD-10-CM

## 2022-11-03 LAB — PROTIME INR (PT): INR: 3.1 U/L — ABNORMAL HIGH (ref 25–110)

## 2022-11-03 NOTE — Progress Notes
Spoke to pt re: INR results. Overall weekly dose total continued and pt to recheck INR in ~ 2 weeks as he indicated it is difficult for him to get to the lab weekly. .Pt verbalized understanding and was agreeable to this plan. No further needs identified at this time.

## 2022-11-10 ENCOUNTER — Encounter: Admit: 2022-11-10 | Discharge: 2022-11-10 | Payer: MEDICARE

## 2022-11-10 DIAGNOSIS — Z952 Presence of prosthetic heart valve: Secondary | ICD-10-CM

## 2022-11-10 DIAGNOSIS — Z7901 Long term (current) use of anticoagulants: Secondary | ICD-10-CM

## 2022-11-10 DIAGNOSIS — I4891 Unspecified atrial fibrillation: Secondary | ICD-10-CM

## 2022-11-10 LAB — PROTIME INR (PT): INR: 2

## 2022-11-25 ENCOUNTER — Encounter: Admit: 2022-11-25 | Discharge: 2022-11-25 | Payer: MEDICARE

## 2022-11-25 DIAGNOSIS — Z7901 Long term (current) use of anticoagulants: Secondary | ICD-10-CM

## 2022-11-25 DIAGNOSIS — Z952 Presence of prosthetic heart valve: Secondary | ICD-10-CM

## 2022-12-03 ENCOUNTER — Encounter: Admit: 2022-12-03 | Discharge: 2022-12-03 | Payer: MEDICARE

## 2022-12-03 DIAGNOSIS — Z952 Presence of prosthetic heart valve: Secondary | ICD-10-CM

## 2022-12-03 DIAGNOSIS — I4891 Unspecified atrial fibrillation: Secondary | ICD-10-CM

## 2022-12-03 DIAGNOSIS — Z7901 Long term (current) use of anticoagulants: Secondary | ICD-10-CM

## 2022-12-17 ENCOUNTER — Encounter: Admit: 2022-12-17 | Discharge: 2022-12-17 | Payer: MEDICARE

## 2022-12-17 DIAGNOSIS — Z7901 Long term (current) use of anticoagulants: Secondary | ICD-10-CM

## 2022-12-17 DIAGNOSIS — Z952 Presence of prosthetic heart valve: Secondary | ICD-10-CM

## 2022-12-17 DIAGNOSIS — I4891 Unspecified atrial fibrillation: Secondary | ICD-10-CM

## 2022-12-17 LAB — PROTIME INR (PT): INR: 3.9 — ABNORMAL HIGH

## 2022-12-18 ENCOUNTER — Encounter: Admit: 2022-12-18 | Discharge: 2022-12-18 | Payer: MEDICARE

## 2022-12-18 NOTE — Telephone Encounter
12/18/2022 11:58 AM   Received call from patient, no message left on RN line. Called patient back at this time. Based on documentation, appears that patient had abnormal INR result yesterday 12/4 and [place] [facility] [place] RNs tried getting in touch w/ patient 3x and no response.     When I called back at this time, patient did not answer either. Unable to leave full VM (per patient's VM mailbox cutting message off too soon). Will attempt to call back again later today.

## 2022-12-18 NOTE — Telephone Encounter
12/18/2022 3:44 PM   Tried calling patient again at this time. No answer, LVM for call back.

## 2022-12-25 ENCOUNTER — Encounter: Admit: 2022-12-25 | Discharge: 2022-12-25 | Payer: MEDICARE

## 2022-12-25 DIAGNOSIS — Z7901 Long term (current) use of anticoagulants: Secondary | ICD-10-CM

## 2022-12-25 DIAGNOSIS — I4891 Unspecified atrial fibrillation: Secondary | ICD-10-CM

## 2022-12-25 DIAGNOSIS — Z952 Presence of prosthetic heart valve: Secondary | ICD-10-CM

## 2022-12-25 LAB — PROTIME INR (PT): INR: 4.2 — ABNORMAL HIGH

## 2022-12-26 ENCOUNTER — Encounter: Admit: 2022-12-26 | Discharge: 2022-12-26 | Payer: MEDICARE

## 2022-12-29 IMAGING — US ECHOCOMPL
1 series · 12 of 24 positions shown · non-contrast
Comparison: none

[Series 1: us echo 2d, complete · 106 acquisitions, 12 frames shown]
[im 5/106]
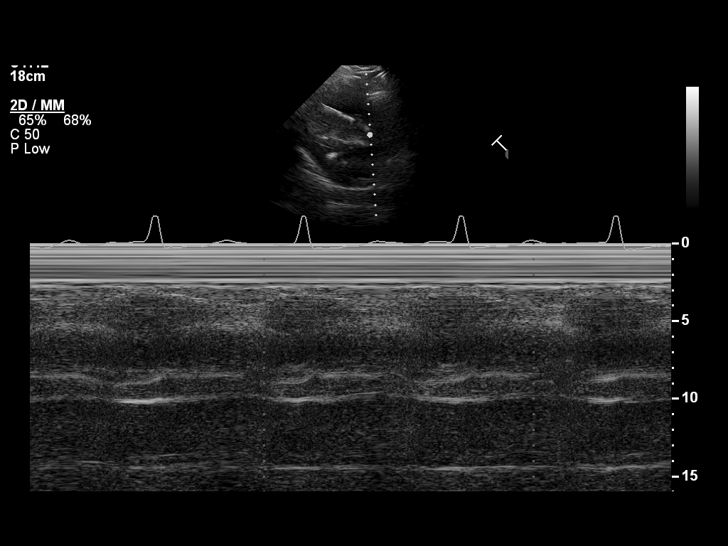
[im 14/106]
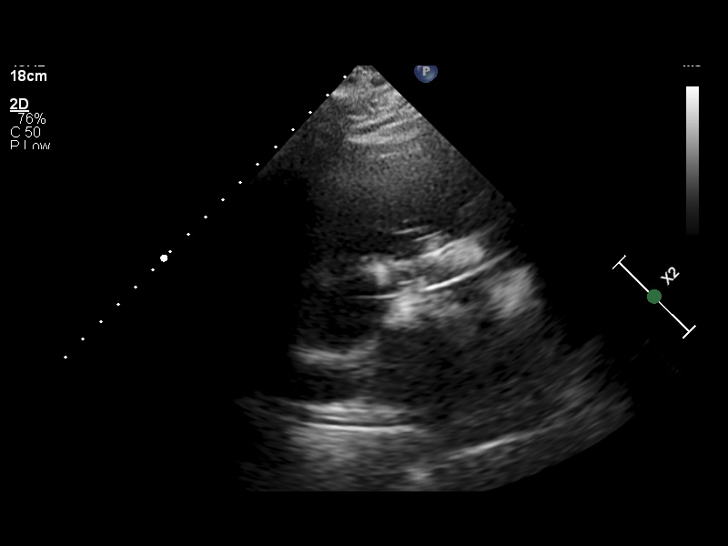
[im 23/106]
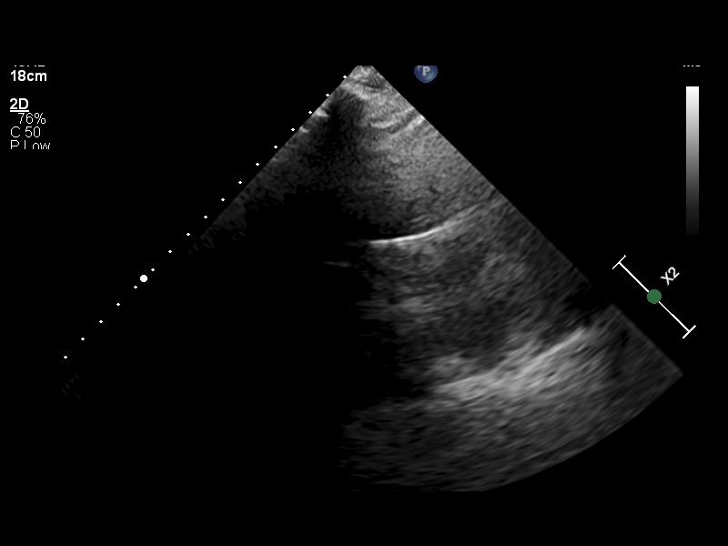
[im 32/106]
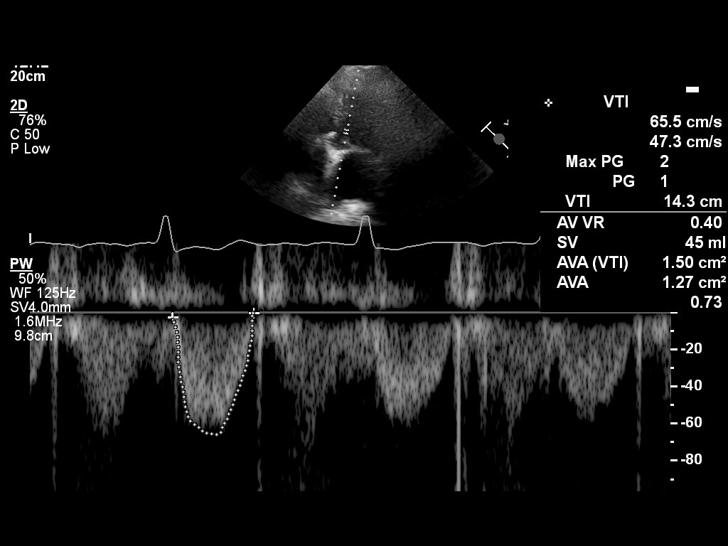
[im 42/106]
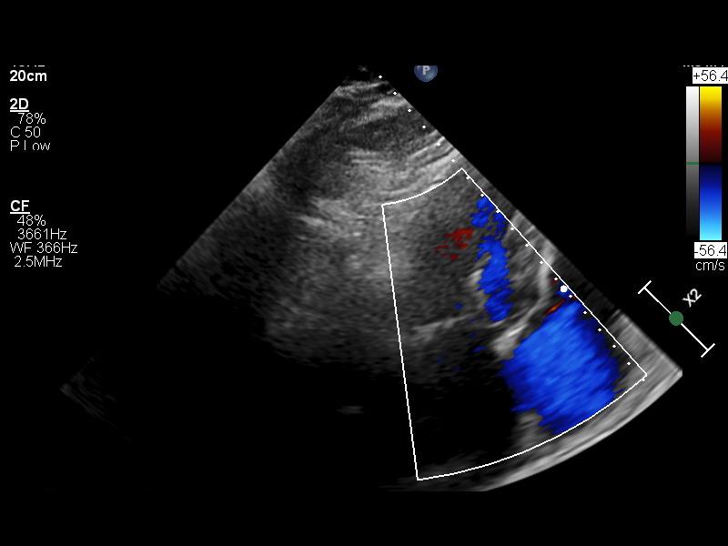
[im 51/106]
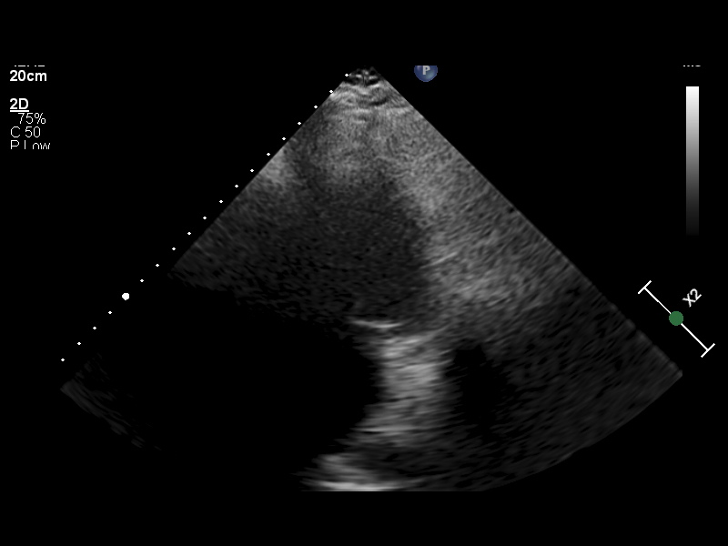
[im 64/106]
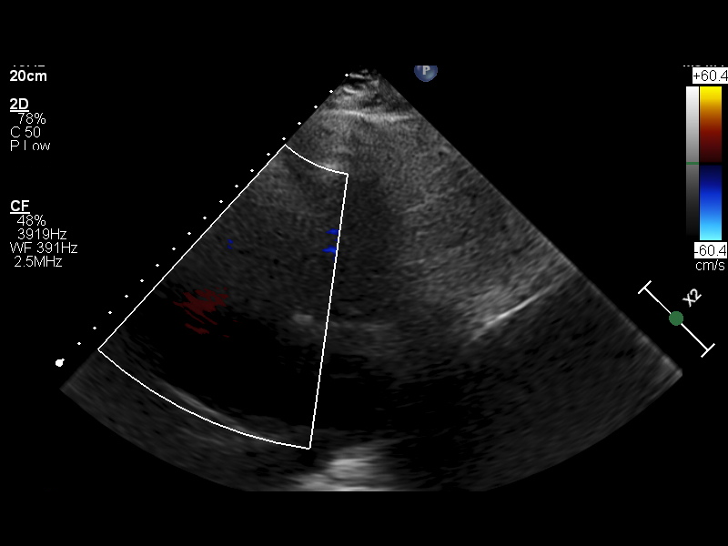
[im 69/106]
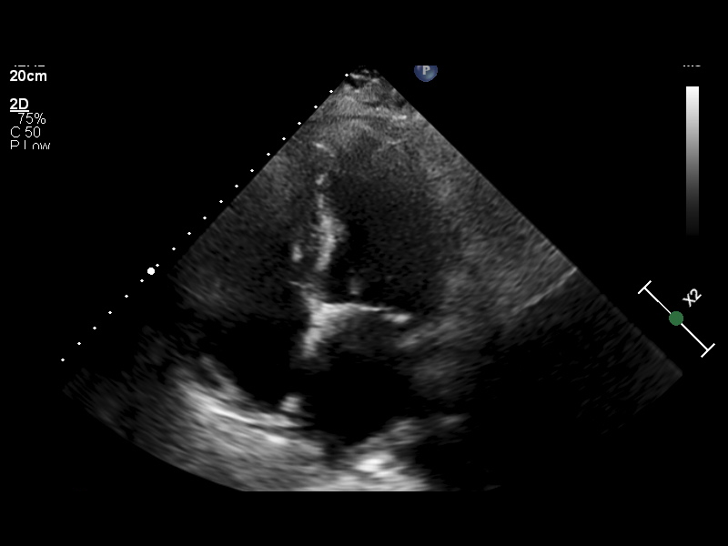
[im 78/106]
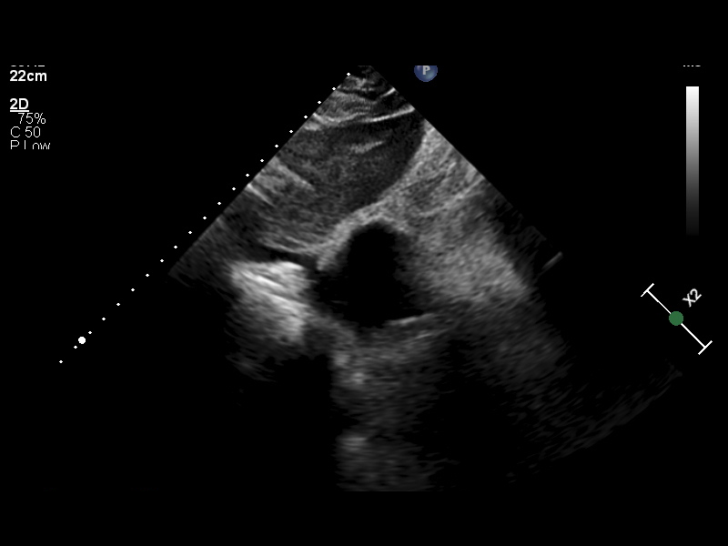
[im 87/106]
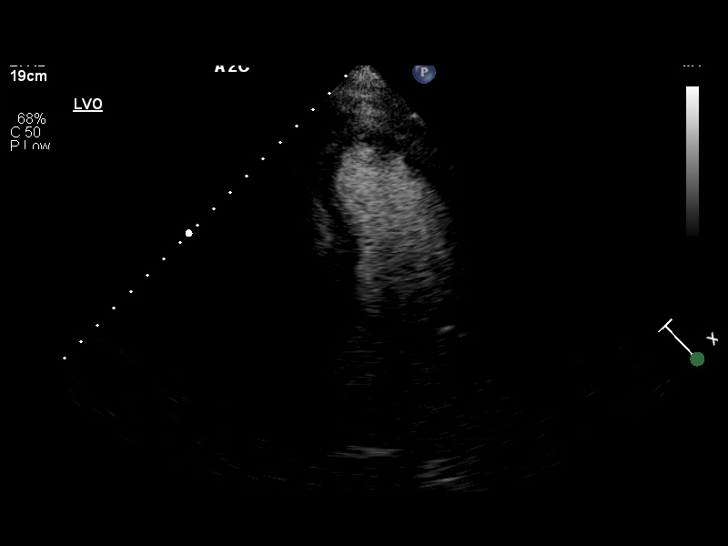
[im 96/106]
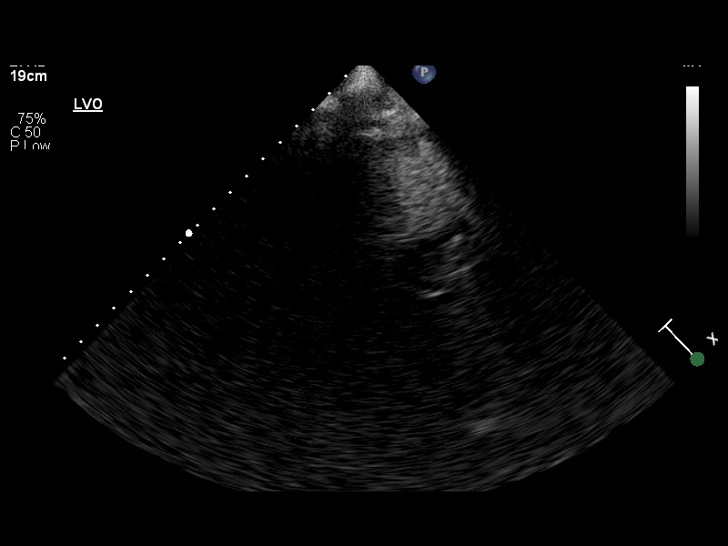
[im 106/106]
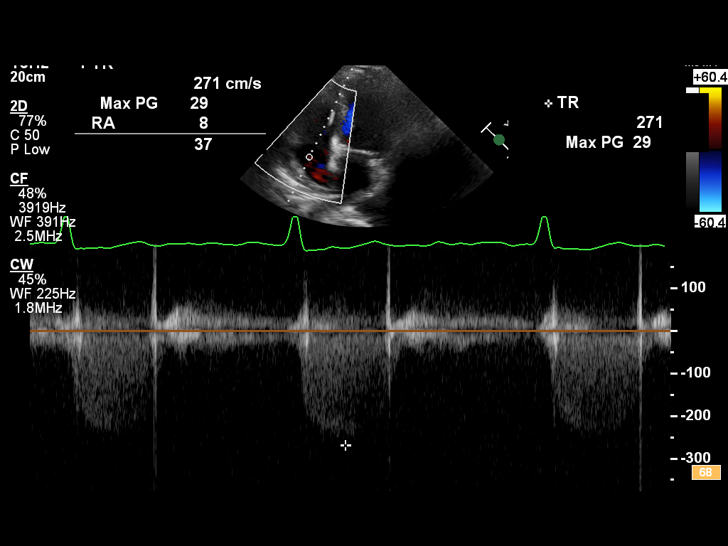

[12 of 24 positions shown; findings below may reference images not displayed]

08/14/21

08/14/21 -  2D + DOPPLER ECHO
Location Performed: [HOSPITAL]

Referring Provider:
Interpreting Physician: [place], Bernadino
Vences:
Location of Interp:
Sonographer: External Staff
Machine:  [employer] 70W

Indications: Aortic stenosis

Definity contrast was given to enhance imaging. Technically difficult study with poor endocardial
visualization.

Vitals
Height   Weight   BSA (Calculated)   BP   Comments
180.3 cm (5' 11")   86.2 kg (190 lb)   2.08   130/73

Interpretation Summary

The left ventricle appears small. Wall thickness is increased. Mild concentric hypertrophy. The left
ventricular systolic function is borderline. The visually estimated ejection fraction is 50%. There
are no segmental wall motion abnormalities. Unable to assess left ventricular diastolic function.
The right ventricle is mildly dilated. The right ventricular systolic function is mildly reduced.
PASP is estimated at 21 mmHg. Pacemaker lead present in the ventricle.
Left Atrium: Severely dilated.
Aortic Valve: The aortic valve was not well seen. No stenosis. No regurgitation.
The aorta was not well seen.
No pericardial effusion.

Compared to study 08/05/2019, the left ventricle systolic function is now borderline versus
hyperdynamic on previous study.

Echocardiographic Findings
Left Ventricle   The left ventricle appears small. Wall thickness is increased. Mild concentric
hypertrophy. The left ventricular systolic function is borderline. The visually estimated ejection
fraction is 50%. There are no segmental wall motion abnormalities. Unable to assess left ventricular
diastolic function.
Right Ventricle   The right ventricle is mildly dilated. The right ventricular systolic function is
mildly reduced. PASP is estimated at 21 mmHg. Pacemaker lead present in the ventricle.
Left Atrium   Severely dilated.
Right Atrium   Mildly dilated. Pacemaker lead present in the right atrium.
IVC/SVC   Normal central venous pressure (0-5 mm Hg).
Mitral Valve   Non-specific thickening. No stenosis. Trace regurgitation.
Tricuspid Valve   Normal valve structure. No stenosis. Mild regurgitation.
Aortic Valve   The aortic valve was not well seen. No stenosis. No regurgitation.
Pulmonary   The pulmonic valve was not seen well but no Doppler evidence of stenosis. No
regurgitation.
Aorta   The aorta was not well seen.
Pericardium   No pericardial effusion.

Left Heart 2D Measurements (Normal Ranges)
EF (Visual)
50 %
LVIDD
4.2 cm  (Range: 4.2 - 5.8)
LVIDS
3.3 cm  (Range: 2.5 - 4.0)
IVS
1.2 cm  (Range: 0.6 - 1.0)
LV PW
1.4 cm  (Range: 0.6 - 1.0)
LA Size
4.0 cm  (Range: 3.0 - 4.0)

Right Heart 2D   M-Mode Measurements (Normal Ranges) (Range)
RV Basal Dia
4.4 cm  (2.5 - 4.1)
RV Mid Dia
3.6 cm  (1.9 - 3.5)
OLGA N
20.7 cm2  (<18)
M-Mode TAPSE
1.4 cm  (>1.7)

Left Heart 2D Addnl Measurements (Normal Ranges)
LV Systolic Vol
28 mL  (Range: 21 - 61)
LV Systolic Vol Index
13 mL/m2  (Range: 11 - 31)
LV Diastolic Vol
70 mL  (Range: 62 - 150)
LV Diastolic Vol Index
34 mL/m2  (Range: 34 - 74)
LA Vol
102 mL  (Range: 18 - 58)
LA Vol Index
49.04 mL/m2  (Range: 16 - 34)
LV Mass
201 g  (Range: 88 - 224)
LV Mass Index
96 g/m2  (Range: 49 - 115)
RWT
0.67  (Range: <=0.42)

Doppler (Spectral and Color Flow)
Estimated Peak Systolic PA Pressure
Aortic valve area
1.45 cm2
Aortic valve peak velocity
1.8 m/s
Aortic valve velocity ratio

Tech Notes:

DEFINITY GIVEN LOT #[id number]

## 2023-01-01 ENCOUNTER — Encounter: Admit: 2023-01-01 | Discharge: 2023-01-01 | Payer: MEDICARE

## 2023-01-01 DIAGNOSIS — Z952 Presence of prosthetic heart valve: Secondary | ICD-10-CM

## 2023-01-01 DIAGNOSIS — Z7901 Long term (current) use of anticoagulants: Secondary | ICD-10-CM

## 2023-01-15 ENCOUNTER — Encounter: Admit: 2023-01-15 | Discharge: 2023-01-15 | Payer: MEDICARE

## 2023-01-15 DIAGNOSIS — Z7901 Long term (current) use of anticoagulants: Secondary | ICD-10-CM

## 2023-01-15 DIAGNOSIS — Z952 Presence of prosthetic heart valve: Secondary | ICD-10-CM

## 2023-01-29 ENCOUNTER — Encounter: Admit: 2023-01-29 | Discharge: 2023-01-29 | Payer: MEDICARE

## 2023-01-29 DIAGNOSIS — Z952 Presence of prosthetic heart valve: Secondary | ICD-10-CM

## 2023-01-29 DIAGNOSIS — Z7901 Long term (current) use of anticoagulants: Principal | ICD-10-CM

## 2023-03-13 ENCOUNTER — Encounter: Admit: 2023-03-13 | Discharge: 2023-03-13 | Payer: MEDICARE

## 2023-03-13 NOTE — Telephone Encounter
 INR Overdue. Called and left message requesting pt to have INR drawn at earliest convenience.  Left callback number for any questions or concerns.

## 2023-03-16 ENCOUNTER — Encounter: Admit: 2023-03-16 | Discharge: 2023-03-16 | Payer: MEDICARE

## 2023-03-16 DIAGNOSIS — Z7901 Long term (current) use of anticoagulants: Secondary | ICD-10-CM

## 2023-03-16 DIAGNOSIS — Z952 Presence of prosthetic heart valve: Secondary | ICD-10-CM

## 2023-03-16 LAB — PROTIME INR (PT): INR: 3

## 2023-04-03 ENCOUNTER — Encounter: Admit: 2023-04-03 | Discharge: 2023-04-03 | Payer: MEDICARE

## 2023-04-03 DIAGNOSIS — Z7901 Long term (current) use of anticoagulants: Secondary | ICD-10-CM

## 2023-04-03 DIAGNOSIS — Z952 Presence of prosthetic heart valve: Secondary | ICD-10-CM

## 2023-04-07 ENCOUNTER — Encounter: Admit: 2023-04-07 | Discharge: 2023-04-07 | Payer: MEDICARE

## 2023-04-07 DIAGNOSIS — Z952 Presence of prosthetic heart valve: Secondary | ICD-10-CM

## 2023-04-07 DIAGNOSIS — Z7901 Long term (current) use of anticoagulants: Secondary | ICD-10-CM

## 2023-04-07 LAB — PROTIME INR (PT): INR: 1.6

## 2023-04-09 ENCOUNTER — Encounter: Admit: 2023-04-09 | Discharge: 2023-04-09 | Payer: MEDICARE

## 2023-04-14 ENCOUNTER — Encounter: Admit: 2023-04-14 | Discharge: 2023-04-14 | Primary: General Practice

## 2023-04-14 ENCOUNTER — Ambulatory Visit: Admit: 2023-04-14 | Discharge: 2023-04-14 | Primary: General Practice

## 2023-04-15 ENCOUNTER — Encounter: Admit: 2023-04-15 | Discharge: 2023-04-15 | Primary: General Practice

## 2023-04-15 DIAGNOSIS — Z952 Presence of prosthetic heart valve: Secondary | ICD-10-CM

## 2023-04-15 DIAGNOSIS — Z7901 Long term (current) use of anticoagulants: Secondary | ICD-10-CM

## 2023-04-15 LAB — PROTIME INR (PT): INR: 2

## 2023-04-16 ENCOUNTER — Encounter: Admit: 2023-04-16 | Discharge: 2023-04-16 | Primary: General Practice

## 2023-04-16 ENCOUNTER — Ambulatory Visit: Admit: 2023-04-16 | Discharge: 2023-04-17 | Primary: General Practice

## 2023-04-16 DIAGNOSIS — R0989 Other specified symptoms and signs involving the circulatory and respiratory systems: Secondary | ICD-10-CM

## 2023-04-16 DIAGNOSIS — I471 Supraventricular tachycardia: Secondary | ICD-10-CM

## 2023-04-16 DIAGNOSIS — R4189 Other symptoms and signs involving cognitive functions and awareness: Secondary | ICD-10-CM

## 2023-04-16 DIAGNOSIS — E7800 Pure hypercholesterolemia, unspecified: Secondary | ICD-10-CM

## 2023-04-16 DIAGNOSIS — I350 Nonrheumatic aortic (valve) stenosis: Secondary | ICD-10-CM

## 2023-04-16 DIAGNOSIS — Z952 Presence of prosthetic heart valve: Secondary | ICD-10-CM

## 2023-04-16 DIAGNOSIS — I2510 Atherosclerotic heart disease of native coronary artery without angina pectoris: Secondary | ICD-10-CM

## 2023-04-16 NOTE — Patient Instructions
 Thank you for visiting our office today.    We would like to make the following medication adjustments:  NONE       Otherwise continue the same medications as you have been doing.          We will be pursuing the following tests after your appointment today:       Orders Placed This Encounter    AMB REFERRAL TO NEUROLOGY    ECG 12-LEAD    2D + DOPPLER ECHO     Scheduling [phone]    We will plan to see you back in 6 months.  Please call us in the meantime with any questions or concerns.        Please allow 5-7 business days for our providers to review your results. All normal results will go to MyChart. If you do not have Mychart, it is strongly recommended to get this so you can easily view all your results. If you do not have mychart, we will attempt to call you once with normal lab and testing results. If we cannot reach you by phone with normal results, we will send you a letter.  If you have not heard the results of your testing after one week please give [place] a call.       Your Cardiovascular Medicine [place] Team Brett Canales, Pilar Jarvis, Shawna Orleans, and [place])  phone number is [phone].

## 2023-04-16 NOTE — Progress Notes
 Cardiovascular Medicine       Date of Service: 04/16/2023      HPI     Dominic Olsen is a 89 y.o. male who was seen today in the Cardiovascular Medicine Clinic at [facility] at our [place] office.      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  He was found to have right upper lobe pneumonia and treated appropriately.  This hospitalization lasted 6 days, he was found to have some orthostatic hypotension.  He did undergo CT of his head because of her recent fall and this did not show any significant findings.    He presents today for follow up.  His son accompanies him today.  He is having significant challenging in recalling recent events, is unclear of his prior cardiac diagnoses.  He denies any chest pain, shortness of breath, lightheadedness.  He complains today of significant fatigue and left lower extremity pain and tingling.  He has yet to address these with his PCP.    Previously saw Dr. Wallene Huh and Dr. Geronimo Boot.  He was followed for rheumatic aortic valve disease, aortic valve stenosis and aortic valve replacement using metallic prosthesis, history of coronary artery disease status post PCA of the LAD in November 2009, who had prior typical history of recurrent dizzy spells and presyncopal spells.  He had classic history for carotid sinus hypersensitivity and confirmed with carotid sinus massage. This led to a permanent pacemaker implantation and subsequently required RV lead repositioning.  At that time, in 2010, it was concerning that he continued to have the same amount of dizzy spells even after the pacemaker, on an average of a dozen events per month.  Dr. 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           ECG: Atrial fibrillation with intermittent ventricular pacing    Transthoracic echocardiogram 11/2021:  Normal left ventricle size.  Mildly thickened basal septum without concentric hypertrophy otherwise.  Mildly reduced ejection fraction.  Visually, left ventricular ejection fraction is ~45-50%.  Abnormal septal motion consistent with right ventricular pacing.  Diastolic function could not be assessed on the study.  Mildly dilated right ventricle with probably normal systolic function.  Moderately dilated left atrium.  Mildly dilated right atrium.  Pacemaker leads are present in the right atrium and right ventricle.  Markedly elevated central venous pressure, 10 to 20 mm Hg.  Nonspecific thickening of the mitral valve with moderate annular calcification.  Mild regurgitation.  A reported prosthetic aortic valve not well visualized.  No stenosis.  No regurgitation.  Estimated peak systolic PA pressure is 49 mm Hg.  The aorta was not well visualized.  Trivial circumferential pericardial effusion.    Device interrogation 04/14/2023:  Underlying Afib   Lead measurements WNL  100% afib burden  V rate >100bpm with af 15% of time  233 fast @/v episodes most recent longest and  2s sec on 03.26.25 with a v average rate if 200bpm.   No changes made       Assessment & Plan   89 y.o. 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  History of coronary artery disease s/p PCI to LAD in 2009.  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  Paroxysmal atrial fibrillation.  Recent pneumonia.  Recent cognitive decline.    Currently, no active cardiovascular symptoms.  Overall, seems well compensated.  Continue warfarin for anticoagulation given paroxysmal atrial fibrillation and mechanical aortic valve.  INR recently within goal.  Recent device interrogation showed 100% atrial fibrillation burden.  I tried to discuss possible cardioversion with him but he is having a challenging time understanding his current medical issues.  At the recent admission, there was some concern for cognitive decline/dementia.  Will make a neurology referral for him to be further evaluated for the same.  Will order transthoracic echocardiogram to evaluate aortic valve.      Return to clinic in 6 months.         Past Medical History  Patient Active Problem List    Diagnosis Date Noted    Supraventricular tachycardia 07/11/2021    Preoperative cardiovascular examination 05/10/2015    CAD (coronary artery disease) 03/23/2015     11/29/2007   IMPRESSIONS:  1. Critical mid left anterior descending artery disease status post 3.0 x 15 Xience drug-     eluting stent deployment postdilated to 3.25 with TIMI 3 flow postprocedure, 0 percent residual stenosis, and no complication.  2. Prosthetic aortic valve noted.        S/P aortic valve replacement 05/25/2008    Stable angina 11/26/2007    Abnormal cardiovascular stress test 11/26/2007    Chronic anticoagulation 11/26/2007    Encounter for pacemaker at end of battery life 11/26/2007     [employer]      Bradycardia 04/29/2007     Carotid Sinus Hypersensitivity      Numbness of arm 02/09/2006    Aortic stenosis      AVR      Hyperlipidemia     Gastritis     Esophageal stricture        I reviewed and confirmed this patient's problem list, active medications, allergies, and past medical, social, family & tobacco histories.     Review of Systems  Review of Systems   Constitutional: Positive for malaise/fatigue.   HENT: Negative.     Eyes: Negative.    Cardiovascular:  Positive for dyspnea on exertion.   Respiratory:  Positive for shortness of breath.    Endocrine: Positive for polyuria.   Hematologic/Lymphatic: Negative.    Skin: Negative.    Musculoskeletal:  Positive for muscle weakness.   Gastrointestinal: Negative.    Genitourinary:  Positive for frequency and nocturia.   Neurological:  Positive for dizziness, light-headedness and weakness.   Psychiatric/Behavioral: Negative.     Allergic/Immunologic: Negative.      14 point review of systems negative except as above.    Vitals:    04/16/23 1304   BP: (!) 145/76   BP Source: Arm, Left Upper   Pulse: 82   SpO2: 97%   PainSc: Zero   Weight: 86.2 kg (190 lb)   Height: 177.8 cm (5' 10)     Body mass index is 27.26 kg/m?Marland Kitchen     Physical Exam  General Appearance: no acute distress  HEENT: EOMI, mucous membranes moist, oropharynx is clear  Neck Veins: neck veins are flat & not distended  Carotid Arteries: no bruits  Chest Inspection: chest is normal in appearance  Auscultation/Percussion: lungs clear to auscultation, no rales, rhonchi, or wheezing  Cardiac Rhythm: regular rhythm & normal rate  Cardiac Auscultation: Normal S1 & S2, no S3 or S4, no rub  Murmurs: 2 out of 6 holosystolic murmur at the right parasternal border.  Abdominal Exam: soft, non-tender, normal bowel sounds, no masses or bruits  Abdominal aorta: nonpalpable   Liver & Spleen: no organomegaly  Extremities: no lower extremity edema; palpable distal pulses  Skin: warm & intact  Neurologic Exam: oriented to time, place and person; no focal neurologic deficits       Cardiovascular Studies  11/29/21   2D + DOPPLER ECHO   Result Value Ref Range    BSA 2.12 m2    Interp Only Sonographer [facility] [place]     LVIDD 4.7 4.2 - 5.8 cm    LVIDS 3.3 2.5 - 4.0 cm    IVS 1.1 0.6 - 1.0 cm    PW 0.9 0.6 - 1.0 cm    FS 29.79 28 - 44 %    Teichholtz 50.64 % LA volume 97 18 - 58 mL    Left Atrium Index 45.75 16 - 34 mL/m2    LV mass 164 88 - 224 g    Left Ventricle Mass Index 78 49 - 115 g/m2    RWT 0.38 <=0.42    LVOT diameter 2.0 cm    LVOT area 3.14 cm2    AV peak velocity 2.2 m/s    Ao VTI 38.5 cm    , with a mean gradient of 8 mmHg    and a peak gradient of 19 mmHg    TR PEAK VELOCITY 2.9 m/s    RV SYSTOLIC PRESSURE 34     RA PRESSURE 15     TV rest pulmonary artery pressure 49 mmHg    TDI Medial e' 0.068 m/s    Medial E/E' ratio 22.50     TDI lateral e' 0.103 m/s    Lateral E/E' ratio 14.85     E wave decelartion time 240.0 msec    MV Peak E Vel PW 1.530 m/s    Right Ventricular Basal Diameter 4.4 2.5 - 4.1 cm    Right Atrial Area 17.3 <18 cm2    Right Ventricular Mid Diameter 3.4 1.9 - 3.5 cm        Cardiovascular Health Factors  Vitals BP Readings from Last 3 Encounters:   04/16/23 (!) 145/76   11/29/21 131/70   11/28/21 134/61     Wt Readings from Last 3 Encounters:   04/16/23 86.2 kg (190 lb)   11/29/21 90.7 kg (200 lb)   11/28/21 80.7 kg (178 lb)     BMI Readings from Last 3 Encounters:   04/16/23 27.26 kg/m?   11/29/21 28.70 kg/m?   11/28/21 24.83 kg/m?      Smoking Social History     Tobacco Use   Smoking Status Former    Current packs/day: 0.00    Types: Cigarettes    Quit date: 01/14/1984    Years since quitting: 39.2   Smokeless Tobacco Never      Lipid Profile Cholesterol   Date Value Ref Range Status   10/31/2016 215 (H) 150 - 200 Final     HDL  Date Value Ref Range Status   10/31/2016 44  Final     LDL   Date Value Ref Range Status   10/31/2016 154 (H) <100 Final     Triglycerides   Date Value Ref Range Status   10/31/2016 132  Final      Blood Sugar No results found for: HGBA1C  Glucose   Date Value Ref Range Status   03/26/2023 116 (H) 70 - 105 Final   03/22/2023 145 (H) 70 - 105 Final   11/27/2021 107 (H) 70 - 105 Final        ASCVD Risk Assessment:     ASCVD 10-year risk calculated: The ASCVD Risk score (Arnett DK, et al., 2019) failed to calculate for the following reasons:    The 2019 ASCVD risk score is only valid for ages 10 to 85     LDL 70-189, if ASCVD 10-y risk is >7.5%, high to moderate-intensity statin therapy is recommended  Diabetes with ASCVD 10-y risk >7.5%, high-intensity statin therapy is recommended.  Diabetes with ASCVD 10-y risk <7.5%, moderate-intensity statin therapy is recommended.      Current Medications (including today's revisions)   CHOLEcalciferoL (vitamin D3) 1,000 units tablet Take one tablet by mouth twice weekly.    MULTIVITAMIN PO Take 1 tablet by mouth daily.    warfarin (COUMADIN) 2 mg tablet Take one-half tablet to one tablet by mouth as directed. Daily by cardiology.    warfarin (COUMADIN) 6 mg tablet TAKE 1 TO 2 TABLETS BY MOUTH ONCE DAILY AS DIRECTED         Orpah Cobb MD  Cardiovascular Medicine.

## 2023-04-22 ENCOUNTER — Encounter: Admit: 2023-04-22 | Discharge: 2023-04-22 | Primary: General Practice

## 2023-04-22 DIAGNOSIS — Z952 Presence of prosthetic heart valve: Secondary | ICD-10-CM

## 2023-04-22 DIAGNOSIS — Z7901 Long term (current) use of anticoagulants: Secondary | ICD-10-CM

## 2023-04-22 LAB — PROTIME INR (PT): INR: 2.6

## 2023-04-24 ENCOUNTER — Encounter: Admit: 2023-04-24 | Discharge: 2023-04-24 | Payer: MEDICARE | Primary: General Practice

## 2023-04-24 ENCOUNTER — Ambulatory Visit: Admit: 2023-04-24 | Discharge: 2023-04-24 | Payer: MEDICARE | Primary: General Practice

## 2023-04-29 ENCOUNTER — Encounter: Admit: 2023-04-29 | Discharge: 2023-04-29 | Payer: MEDICARE | Primary: General Practice

## 2023-04-29 DIAGNOSIS — Z952 Presence of prosthetic heart valve: Secondary | ICD-10-CM

## 2023-04-29 DIAGNOSIS — Z7901 Long term (current) use of anticoagulants: Secondary | ICD-10-CM

## 2023-04-29 LAB — PROTIME INR (PT): INR: 4.2

## 2023-05-04 ENCOUNTER — Encounter: Admit: 2023-05-04 | Discharge: 2023-05-04 | Payer: MEDICARE | Primary: General Practice

## 2023-05-29 ENCOUNTER — Encounter: Admit: 2023-05-29 | Discharge: 2023-05-29 | Payer: MEDICARE | Primary: General Practice

## 2023-05-29 NOTE — Telephone Encounter
 Patient was discharged to [facility]  Dr Herminia Lope Darland appears to be managing INR.    [phone]     Called and confirmed.

## 2023-06-01 ENCOUNTER — Encounter: Admit: 2023-06-01 | Discharge: 2023-06-01 | Payer: MEDICARE | Primary: General Practice

## 2023-06-01 DIAGNOSIS — Z952 Presence of prosthetic heart valve: Secondary | ICD-10-CM

## 2023-06-01 DIAGNOSIS — Z7901 Long term (current) use of anticoagulants: Secondary | ICD-10-CM

## 2023-06-12 ENCOUNTER — Encounter: Admit: 2023-06-12 | Discharge: 2023-06-12 | Payer: MEDICARE | Primary: General Practice

## 2023-06-12 DIAGNOSIS — E7800 Pure hypercholesterolemia, unspecified: Secondary | ICD-10-CM

## 2023-06-12 DIAGNOSIS — R4189 Other symptoms and signs involving cognitive functions and awareness: Secondary | ICD-10-CM

## 2023-06-12 DIAGNOSIS — I2510 Atherosclerotic heart disease of native coronary artery without angina pectoris: Secondary | ICD-10-CM

## 2023-06-12 NOTE — Telephone Encounter
-----   Message from [place], MD sent at 05/01/2023 12:26 PM CDT -----  Regarding: RE: Neuro Referral Update  Can we message neuro scheduling directly to see what initial work up they want?  Thanks  Swathi  ----- Message -----  From: Celesta Coke, RN  Sent: 04/22/2023   4:44 PM CDT  To: Balinda Bong, MD  Subject: Neuro Referral Update                            Hello!    We received a fax stating that patient's neuro referral placed during 4/3 OV for cognitive decline has been deferred due to incomplete initial workup .      I have placed a copy of the deferral in the patient's chart. I will also reach out to patient's PCP to assist with further resources and testing.     Let me know if you have any further recommendations.     Thank you!    Verdis Glade, RN

## 2023-06-12 NOTE — Telephone Encounter
 Discussed Memory Clinic Referrals (04/22/2023)  that we received with Dr. Abelino Hof in clinic. She would like another referral placed to General Neurology due to being being declined by memory clinic.
